# Patient Record
Sex: Female | Born: 1995 | Race: Black or African American | Hispanic: No | Marital: Single | Smoking: Never smoker
Health system: Southern US, Community
[De-identification: ages and names within clinical notes are randomized; demographics above are authoritative.]

## PROBLEM LIST (undated history)

## (undated) DIAGNOSIS — R519 Headache, unspecified: Secondary | ICD-10-CM

## (undated) DIAGNOSIS — F419 Anxiety disorder, unspecified: Secondary | ICD-10-CM

## (undated) DIAGNOSIS — N3010 Interstitial cystitis (chronic) without hematuria: Secondary | ICD-10-CM

## (undated) DIAGNOSIS — R87629 Unspecified abnormal cytological findings in specimens from vagina: Secondary | ICD-10-CM

## (undated) DIAGNOSIS — F32A Depression, unspecified: Secondary | ICD-10-CM

## (undated) HISTORY — DX: Interstitial cystitis (chronic) without hematuria: N30.10

## (undated) HISTORY — DX: Headache, unspecified: R51.9

## (undated) HISTORY — DX: Unspecified abnormal cytological findings in specimens from vagina: R87.629

## (undated) HISTORY — DX: Depression, unspecified: F32.A

## (undated) HISTORY — DX: Anxiety disorder, unspecified: F41.9

## (undated) HISTORY — PX: NO PAST SURGERIES: SHX2092

---

## 2018-04-05 DIAGNOSIS — B009 Herpesviral infection, unspecified: Secondary | ICD-10-CM

## 2018-04-05 HISTORY — DX: Herpesviral infection, unspecified: B00.9

## 2019-10-20 ENCOUNTER — Emergency Department (HOSPITAL_BASED_OUTPATIENT_CLINIC_OR_DEPARTMENT_OTHER)
Admission: EM | Admit: 2019-10-20 | Discharge: 2019-10-21 | Disposition: A | Discharge status: Home / Self Care | Payer: BLUE CROSS/BLUE SHIELD | Attending: Emergency Medicine | Admitting: Emergency Medicine

## 2019-10-20 ENCOUNTER — Other Ambulatory Visit: Payer: Self-pay

## 2019-10-20 ENCOUNTER — Encounter (HOSPITAL_BASED_OUTPATIENT_CLINIC_OR_DEPARTMENT_OTHER): Payer: Self-pay

## 2019-10-20 ENCOUNTER — Emergency Department (HOSPITAL_BASED_OUTPATIENT_CLINIC_OR_DEPARTMENT_OTHER): Payer: BLUE CROSS/BLUE SHIELD

## 2019-10-20 DIAGNOSIS — E860 Dehydration: Secondary | ICD-10-CM | POA: Insufficient documentation

## 2019-10-20 DIAGNOSIS — R079 Chest pain, unspecified: Secondary | ICD-10-CM | POA: Diagnosis present

## 2019-10-20 DIAGNOSIS — Z202 Contact with and (suspected) exposure to infections with a predominantly sexual mode of transmission: Secondary | ICD-10-CM | POA: Insufficient documentation

## 2019-10-20 LAB — TROPONIN I (HIGH SENSITIVITY)
Troponin I (High Sensitivity): <2 ng/L (ref <18)
Troponin I (High Sensitivity): <2 ng/L (ref <18)

## 2019-10-20 LAB — WET PREP, GENITAL
Clue Cells Wet Prep HPF POC: NONE SEEN
Sperm: NONE SEEN
Trich, Wet Prep: NONE SEEN
Yeast Wet Prep HPF POC: NONE SEEN

## 2019-10-20 LAB — CBC
HCT: 42.9 % (ref 36.0–46.0)
Hemoglobin: 14.3 g/dL (ref 12.0–15.0)
MCH: 31.2 pg (ref 26.0–34.0)
MCHC: 33.3 g/dL (ref 30.0–36.0)
MCV: 93.7 fL (ref 80.0–100.0)
Platelets: 314 10*3/uL (ref 150–400)
RBC: 4.58 MIL/uL (ref 3.87–5.11)
RDW: 12.3 % (ref 11.5–15.5)
WBC: 5.9 10*3/uL (ref 4.0–10.5)
nRBC: 0 % (ref 0.0–0.2)

## 2019-10-20 LAB — BASIC METABOLIC PANEL
Anion gap: 11 (ref 5–15)
BUN: 12 mg/dL (ref 6–20)
CO2: 27 mmol/L (ref 22–32)
Calcium: 9.4 mg/dL (ref 8.9–10.3)
Chloride: 101 mmol/L (ref 98–111)
Creatinine, Ser: 0.78 mg/dL (ref 0.44–1.00)
GFR calc Af Amer: >60 mL/min (ref >60)
GFR calc non Af Amer: >60 mL/min (ref >60)
Glucose, Bld: 93 mg/dL (ref 70–99)
Potassium: 4.1 mmol/L (ref 3.5–5.1)
Sodium: 139 mmol/L (ref 135–145)

## 2019-10-20 LAB — PREGNANCY, URINE: Preg Test, Ur: NEGATIVE

## 2019-10-20 MED ORDER — IBUPROFEN 400 MG PO TABS
400.0000 mg | ORAL_TABLET | Freq: Once | ORAL | Status: AC
  Administered 2019-10-20: 400 mg via ORAL
  Filled 2019-10-20: qty 1

## 2019-10-20 MED ORDER — LACTATED RINGERS IV BOLUS
1000.0000 mL | Freq: Once | INTRAVENOUS | Status: AC
  Administered 2019-10-20: 1000 mL via INTRAVENOUS

## 2019-10-20 MED ORDER — ACETAMINOPHEN 325 MG PO TABS
650.0000 mg | ORAL_TABLET | Freq: Once | ORAL | Status: AC
  Administered 2019-10-20: 650 mg via ORAL
  Filled 2019-10-20: qty 2

## 2019-10-20 NOTE — ED Triage Notes (Signed)
1) Pt c/o chest pain while at rest. Pt describes pain as tight and non-radiating. Pt reports that she has associated ShOB.   2) Pt also states she had strep 2 weeks ago and is still having some throat irritation.   3) Pt requesting STD testing. Pt denies any symptoms.
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
## 2019-10-21 LAB — RPR: RPR Ser Ql: NONREACTIVE

## 2019-10-21 LAB — HIV ANTIBODY (ROUTINE TESTING W REFLEX): HIV Screen 4th Generation wRfx: NONREACTIVE

## 2019-10-21 MED ORDER — LIDOCAINE HCL (PF) 1 % IJ SOLN
INTRAMUSCULAR | Status: AC
  Administered 2019-10-21: 2 mL
  Filled 2019-10-21: qty 5

## 2019-10-21 MED ORDER — CEFTRIAXONE SODIUM 500 MG IJ SOLR
500.0000 mg | Freq: Once | INTRAMUSCULAR | Status: AC
  Administered 2019-10-21: 500 mg via INTRAMUSCULAR
  Filled 2019-10-21: qty 500

## 2019-10-21 MED ORDER — DOXYCYCLINE HYCLATE 100 MG PO CAPS
100.0000 mg | ORAL_CAPSULE | Freq: Two times a day (BID) | ORAL | 0 refills | Status: DC
Start: 2019-10-21 — End: 2020-08-11

## 2019-10-21 MED ORDER — LIDOCAINE HCL (PF) 1 % IJ SOLN: 2.0000 mL | Freq: Once | INTRAMUSCULAR | Status: AC

## 2019-10-21 NOTE — Discharge Instructions (Signed)
Take tylenol/ibuprofen as needed for pain.  Drink plenty of fluid and rest.  You can also try tea with honey for sore throat.

## 2019-10-22 LAB — GC/CHLAMYDIA PROBE AMP (~~LOC~~) NOT AT ARMC
Chlamydia: NEGATIVE
Comment: NEGATIVE
Comment: NORMAL
Neisseria Gonorrhea: NEGATIVE

## 2020-02-08 ENCOUNTER — Encounter (HOSPITAL_COMMUNITY): Payer: Self-pay | Admitting: Emergency Medicine

## 2020-02-08 ENCOUNTER — Other Ambulatory Visit: Payer: Self-pay

## 2020-02-08 DIAGNOSIS — R1030 Lower abdominal pain, unspecified: Secondary | ICD-10-CM | POA: Diagnosis not present

## 2020-02-08 DIAGNOSIS — Z3A08 8 weeks gestation of pregnancy: Secondary | ICD-10-CM | POA: Insufficient documentation

## 2020-02-08 DIAGNOSIS — O26891 Other specified pregnancy related conditions, first trimester: Secondary | ICD-10-CM | POA: Insufficient documentation
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
## 2020-02-09 ENCOUNTER — Emergency Department (HOSPITAL_COMMUNITY)
Admission: EM | Admit: 2020-02-09 | Discharge: 2020-02-09 | Disposition: A | Discharge status: Home / Self Care | Payer: BLUE CROSS/BLUE SHIELD | Attending: Emergency Medicine | Admitting: Emergency Medicine

## 2020-02-09 DIAGNOSIS — O26891 Other specified pregnancy related conditions, first trimester: Secondary | ICD-10-CM

## 2020-02-09 DIAGNOSIS — R109 Unspecified abdominal pain: Secondary | ICD-10-CM

## 2020-02-09 MED ORDER — ACETAMINOPHEN 325 MG PO TABS
650.0000 mg | ORAL_TABLET | Freq: Once | ORAL | Status: AC
  Administered 2020-02-09: 650 mg via ORAL
  Filled 2020-02-09: qty 2
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
## 2020-02-09 NOTE — Discharge Instructions (Addendum)
Make sure to get plenty of rest.  Take Tylenol as needed.  If you develop vaginal bleeding or worsening cramping, you should call your OB/GYN for further instruction.  At this time your ultrasound is reassuring.

## 2020-02-15 LAB — OB RESULTS CONSOLE ANTIBODY SCREEN: Antibody Screen: NEGATIVE

## 2020-02-15 LAB — OB RESULTS CONSOLE HEPATITIS B SURFACE ANTIGEN: Hepatitis B Surface Ag: NEGATIVE

## 2020-02-15 LAB — OB RESULTS CONSOLE ABO/RH: RH Type: POSITIVE

## 2020-02-15 LAB — OB RESULTS CONSOLE HIV ANTIBODY (ROUTINE TESTING): HIV: NONREACTIVE

## 2020-02-15 LAB — OB RESULTS CONSOLE RUBELLA ANTIBODY, IGM: Rubella: IMMUNE

## 2020-02-15 LAB — OB RESULTS CONSOLE RPR: RPR: NONREACTIVE

## 2020-03-13 ENCOUNTER — Other Ambulatory Visit: Payer: Self-pay | Admitting: Obstetrics and Gynecology

## 2020-03-14 ENCOUNTER — Other Ambulatory Visit: Payer: Self-pay | Admitting: Obstetrics and Gynecology

## 2020-03-14 DIAGNOSIS — O0991 Supervision of high risk pregnancy, unspecified, first trimester: Secondary | ICD-10-CM

## 2020-03-18 ENCOUNTER — Other Ambulatory Visit: Payer: Self-pay

## 2020-03-18 ENCOUNTER — Ambulatory Visit (HOSPITAL_BASED_OUTPATIENT_CLINIC_OR_DEPARTMENT_OTHER): Payer: BLUE CROSS/BLUE SHIELD | Admitting: Genetic Counselor

## 2020-03-18 ENCOUNTER — Ambulatory Visit: Payer: BLUE CROSS/BLUE SHIELD | Admitting: *Deleted

## 2020-03-18 ENCOUNTER — Ambulatory Visit: Payer: Self-pay | Admitting: Genetic Counselor

## 2020-03-18 ENCOUNTER — Ambulatory Visit: Payer: Self-pay

## 2020-03-18 ENCOUNTER — Ambulatory Visit: Payer: BLUE CROSS/BLUE SHIELD | Attending: Obstetrics and Gynecology

## 2020-03-18 VITALS — BP 132/69 | HR 85

## 2020-03-18 DIAGNOSIS — O281 Abnormal biochemical finding on antenatal screening of mother: Secondary | ICD-10-CM | POA: Insufficient documentation

## 2020-03-18 DIAGNOSIS — Z315 Encounter for genetic counseling: Secondary | ICD-10-CM | POA: Insufficient documentation

## 2020-03-18 DIAGNOSIS — O285 Abnormal chromosomal and genetic finding on antenatal screening of mother: Secondary | ICD-10-CM

## 2020-03-18 DIAGNOSIS — O280 Abnormal hematological finding on antenatal screening of mother: Secondary | ICD-10-CM

## 2020-03-18 DIAGNOSIS — O0991 Supervision of high risk pregnancy, unspecified, first trimester: Secondary | ICD-10-CM

## 2020-03-18 NOTE — Progress Notes (Signed)
ADDENDUM 4:45 PM: I called Ms. Ertle to inform her about her results from her benefits investigation. Per a billing representative from [facility], the expected out of pocket cost for NIPS if pursued through insurance is $450. Ms. Dolby elected to pursue NIPS through [facility]'s patient pay price of $99. She will return to our office during her lunch break tomorrow (03/19/20) to have a sample drawn for NIPS. Results will take approximately one week to be returned. I will call Ms. Pagliuca once results become available.  ------------------------------------------------------------------------------------------------------------------------  03/18/2020  Clovis Cao May 29, 1995 MRN: [id number] DOV: 03/18/2020  Ms. 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 Ms. Jawad was accompanied to her appointment by her partner, Irine Seal.   Indication for genetic counseling - First trimester screening high-risk for trisomy 15  Prenatal history  Ms. Billey is a G35P0, 24 y.o. female. Her current pregnancy has completed [redacted]w[redacted]d (Estimated Date of Delivery: 09/16/20).  Ms. Seivert denied exposure to environmental toxins or chemical agents. She denied the use of alcohol, tobacco or street drugs. She reported taking prenatal vitamins and Sertraline. She denied significant viral illnesses, fevers, and bleeding during the course of her pregnancy. She reported a history of interstitial cystitis which has improved during pregnancy. Her medical and surgical histories were noncontributory.  Family History  A three generation pedigree was drafted and reviewed. The family history is remarkable for the following:  - Ms. Woolbright's mother was reportedly born with a congenital heart defect for which she has had open heart surgery twice. Congenital heart defects (CHDs) are most often multifactorial in etiology, but can  also result from chromosome aberrations, single gene conditions, or teratogenic exposures. We discussed that isolated, nonsyndromic CHDs occur in ~0.5-1% of the general population. If Ms. Stoudt's mother had an isolated CHD, the risk of recurrence for Ms. Stelly's children may be ~1%. If however, her mother had an underlying genetic condition that caused the CHD, the risk of recurrence could be increased. In this case, the specific chance of recurrence would depend on the inheritance of the condition. We discussed that a detailed anatomy ultrasound will be performed around 18-20 weeks' gestation to assess the development of the fetus's heart.   - Mr. Yewitt had growth delays requiring the use of growth hormones. He also had ADHD and learning difficulties that he was pulled out of class for, specifically for help in [place]. Diabetes, problems with blood pressure, and vision problems including cataracts are common on his paternal side of the family. We discussed that oftentimes, these kinds of health problems are multifactorial in nature, occurring due to a combination of genetic and environmental factors that can be difficult to identify. I encouraged the couple to inform their chosen pediatrician about their family history information should any concerns arise in their children.  The remaining family histories were reviewed and found to be noncontributory for birth defects, intellectual disability, recurrent pregnancy loss, and known genetic conditions. Ms. Benbrook had limited information about her paternal family history; thus, risk assessment was limited.   The patient's country of origin is the [place]. The father of the pregnancy's countries of origin are [place] and the [place]. Consanguinity was denied. Ms. Gama's partner was uncertain whether or not he has Ashkenazi Jewish ancestry. Pedigree will be scanned under Media.  Discussion  First trimester screening results:  Ms. Primeau  was referred for genetic counseling as  results from first trimester screening came back positive for an increased risk of trisomy 43, AKA Down syndrome, in the current pregnancy. Based on results from first trimester screening, the risk for Down syndrome in the current pregnancy increased from Ms. Curenton's 1 in 1020 age-related risk to 1 in 131 (0.8%). Additionally, the risk for trisomy 13/18 in the current pregnancy was decreased to less than 1 in 10,000 based on the results from this screen.   Down syndrome is one of the most common extra chromosome conditions, as approximately 1 in 800 babies are born with this condition. There are different types of Down syndrome, with each type determined by the arrangement of the chromosome 21 pair. Most cases are caused by an entire extra copy of chromosome 21 (trisomy 21). We reviewed that Down syndrome most commonly occurs by chance due to an error in chromosomal division during the formation of egg and sperm cells in a process called nondisjunction.    We reviewed that Down syndrome is characterized by a distinctive facial appearance, mild to moderate intellectual disability, and an increased chance for a heart defect. Approximately half of babies with Down syndrome are born with a heart defect that may require surgery after birth. While many children with Down syndrome look similar to each other, each child with Down syndrome is unique and will have many more features in common with his or her own family members. Children with Down syndrome also have an increased chance for thyroid problems, which can range from an underactive to an overactive thyroid. Additionally, low muscle tone, gastrointestinal abnormalities, vision problems, and respiratory and ear infections are more common among babies with Down syndrome. We discussed that there are many more features that can be associated with Down syndrome; however, it is not possible to accurately predict all features that  would be present in an individual with Down syndrome prenatally. Additionally, there is a high degree of variability seen among children who have this condition, meaning that every child with Down syndrome will not be affected in exactly the same way, and some children will have more or less features than others. It is not possible to predict what strengths and weaknesses a child with Down syndrome will have, just like it is not possible to predict this for any child.  We discussed that first trimester screening measures levels of hormones that are made by the body during pregnancy. One of these hormones is called human chorionic gonadotropin (hCG). Another of these hormones is called pregnancy-associated plasma protein A (PAPP-A). Ms. Dreier's hCG level was increased (2.46 MoM) on first trimester screening and her PAPP-A level was decreased (0.35 MoM). We reviewed that increased levels of hCG and decreased levels of PAPP-A can be seen in pregnancies affected by Down syndrome, but can also be associated with preeclampsia, fetal growth restriction, preterm birth, and fetal loss. Thus, even if the fetus does not have Down syndrome, the pregnancy may be at risk for other adverse obstetrical outcomes. We discussed that it is recommended that Ms. Caillouet have a third trimester ultrasound to assess fetal growth.  Other testing options:  We reviewed additional available screening and testing options for chromosomal aneuploidies such as trisomy 44. Firstly, Ms. Trang will have an anatomy ultrasound performed around 18-20 weeks' gestation to assess for fetal anomalies or markers suggestive of aneuploidy. Ms. Gane was counseled that ~50% of fetuses with Down syndrome demonstrate a sign of the respective conditions on anatomy ultrasound. A complete 12-14 week ultrasound was  performed today prior to our visit. The ultrasound report will be sent under separate cover. There were no visualized fetal anomalies at this early  gestational age or markers suggestive of aneuploidy.  Secondly, we reviewed noninvasive prenatal screening (NIPS) as another available aneuploidy screening option to further refine the chance for Down syndrome in the pregnancy. Specifically, we discussed that NIPS analyzes cell free DNA originating from the placenta that is found in the maternal blood circulation during pregnancy instead of hormone levels. NIPS is not diagnostic for chromosome conditions, but can provide information regarding the presence or absence of extra fetal DNA for chromosomes 13, 18, 21, and the sex chromosomes. Thus, it would not identify or rule out all fetal aneuploidy. The reported detection rate is 91-99% for trisomies 21, 18, 13, and sex chromosome aneuploidies, which is higher than the detection rate associated with first trimester screening. The false positive rate is reported to be less than 0.1% for any of the conditions on NIPS, which is lower than the false positive rate associated with first trimester screening. Ms. Salaam indicated that she is interested in pursuing NIPS.   Finally, Ms. Carbonneau was counseled regarding the option of diagnostic testing via chorionic villus sampling (CVS) or amniocentesis. We discussed the technical aspects of each procedure and quoted up to a 1 in 500 (0.2%) risk for spontaneous pregnancy loss or other adverse pregnancy outcomes as a result of either procedure. Cultured cells from either a placental or amniotic fluid sample allow for the visualization of a fetal karyotype, which can detect >99% of large chromosomal aberrations, including trisomy 21. Chromosomal microarray can also be performed to identify smaller deletions or duplications of fetal chromosomal material. After careful consideration, Ms. Sobotta declined diagnostic testing at this time. She understands that diagnostic testing is available at any point through the end of pregnancy and that she may opt to undergo the procedure at a later  date should she change her mind. She also understands that diagnostic testing is the only way to definitively determine if the fetus has trisomy 21 prenatally. She prefers to pursue NIPS before considering diagnostic testing.  Plan:  Ms. Dethlefs is interested in undergoing NIPS. However, she requested that I perform a benefits investigation to determine the expected out of pocket cost before ordering testing. If testing is expected to cost >$99 when pursued through insurance, Ms. Karwowski will elect to proceed with NIPS through the laboratory [facility] who offers a patient pay price of $99. I will contact her when the benefits investigation is complete and schedule her for a blood draw.  I counseled Ms. Perret regarding the above risks and available options. The approximate face-to-face time with the genetic counselor was 40 minutes.  In summary:  Discussed first trimester screening results and options for follow-up testing  1 in 131 (0.8%) for current pregnancy to be affected by Down syndrome  Would like to pursue NIPS. I will perform benefits investigation to determine out of pocket cost. Patient will pursue $99 patient pay price if estimated cost through insurance is greater. I will facilitate sample collection & follow results  Reviewed results of ultrasound  No fetal anomalies or markers seen at this early gestational age  Offered additional testing and screening  Declined chorionic villus sampling  Reviewed family history concerns   Gershon Crane, MS, Aeronautical engineer

## 2020-03-19 ENCOUNTER — Ambulatory Visit: Payer: BLUE CROSS/BLUE SHIELD | Attending: Obstetrics and Gynecology

## 2020-04-03 ENCOUNTER — Ambulatory Visit: Payer: BLUE CROSS/BLUE SHIELD | Attending: Obstetrics and Gynecology

## 2020-04-03 ENCOUNTER — Other Ambulatory Visit: Payer: Self-pay

## 2020-04-03 DIAGNOSIS — Z3482 Encounter for supervision of other normal pregnancy, second trimester: Secondary | ICD-10-CM | POA: Insufficient documentation

## 2020-04-03 DIAGNOSIS — Z3A18 18 weeks gestation of pregnancy: Secondary | ICD-10-CM | POA: Insufficient documentation

## 2020-04-03 NOTE — Progress Notes (Signed)
Blood drawn in MFM today. 

## 2020-04-09 ENCOUNTER — Other Ambulatory Visit: Payer: Self-pay

## 2020-04-09 ENCOUNTER — Telehealth: Payer: Self-pay | Admitting: Genetic Counselor

## 2020-04-09 NOTE — Telephone Encounter (Signed)
I called Ms. Mchugh to discuss her negative noninvasive prenatal screening (NIPS) result. Specifically, Ms. Pennypacker had NIPS through the laboratory [facility]. Testing was offered because of first trimester screening results that were high risk for trisomy 21 AKA Down syndrome. These negative results demonstrated an expected representation of chromosome 21, 18, 13, and sex chromosome material, greatly reducing the likelihood of trisomies 72, 29, or 45 and sex chromosome aneuploidies for the pregnancy. Ms. Nygard requested to know about the expected fetal sex, which is female.  NIPS analyzes placental DNA in maternal circulation. I reminded Ms. Bassford that NIPS is considered to be highly specific and sensitive, but is not considered to be a diagnostic test. We reviewed that this testing identifies 99% of pregnancies with trisomies 21, which is a higher sensitivity than first trimester screening. We also reviewed that NIPS can detect 91-99% of cases of trisomy 63, trisomy 5, and sex chromosome abnormalities. However, cases of these conditions may rarely be missed and NIPS does not test for all genetic conditions. Ms. Jurczyk was reminded that diagnostic testing via amniocentesis is available through the end of her pregnancy should she be interested in confirming this result. Ms. Mcqueary was relieve to hear about these results and confirmed that she had no questions further questions at this time.  Gershon Crane, MS, [facility] Genetic Counselor

## 2020-08-09 ENCOUNTER — Encounter (HOSPITAL_COMMUNITY): Payer: Self-pay | Admitting: Obstetrics and Gynecology

## 2020-08-09 ENCOUNTER — Observation Stay (HOSPITAL_COMMUNITY)
Admission: AD | Admit: 2020-08-09 | Discharge: 2020-08-11 | Disposition: A | Discharge status: Home / Self Care | Payer: 59 | Attending: Obstetrics and Gynecology | Admitting: Obstetrics and Gynecology

## 2020-08-09 ENCOUNTER — Other Ambulatory Visit: Payer: Self-pay

## 2020-08-09 DIAGNOSIS — Z3A34 34 weeks gestation of pregnancy: Secondary | ICD-10-CM | POA: Insufficient documentation

## 2020-08-09 DIAGNOSIS — O113 Pre-existing hypertension with pre-eclampsia, third trimester: Secondary | ICD-10-CM | POA: Diagnosis not present

## 2020-08-09 DIAGNOSIS — Z20822 Contact with and (suspected) exposure to covid-19: Secondary | ICD-10-CM | POA: Insufficient documentation

## 2020-08-09 DIAGNOSIS — O1490 Unspecified pre-eclampsia, unspecified trimester: Secondary | ICD-10-CM

## 2020-08-09 DIAGNOSIS — O10013 Pre-existing essential hypertension complicating pregnancy, third trimester: Secondary | ICD-10-CM | POA: Diagnosis not present

## 2020-08-09 DIAGNOSIS — O288 Other abnormal findings on antenatal screening of mother: Secondary | ICD-10-CM

## 2020-08-09 LAB — WET PREP, GENITAL
Clue Cells Wet Prep HPF POC: NONE SEEN
Sperm: NONE SEEN
Trich, Wet Prep: NONE SEEN
Yeast Wet Prep HPF POC: NONE SEEN

## 2020-08-09 LAB — COMPREHENSIVE METABOLIC PANEL
ALT: 12 U/L (ref 0–44)
AST: 17 U/L (ref 15–41)
Albumin: 2.6 g/dL — ABNORMAL LOW (ref 3.5–5.0)
Alkaline Phosphatase: 135 U/L — ABNORMAL HIGH (ref 38–126)
Anion gap: 9 (ref 5–15)
BUN: 6 mg/dL (ref 6–20)
CO2: 23 mmol/L (ref 22–32)
Calcium: 9.1 mg/dL (ref 8.9–10.3)
Chloride: 103 mmol/L (ref 98–111)
Creatinine, Ser: 0.57 mg/dL (ref 0.44–1.00)
GFR, Estimated: >60 mL/min (ref >60)
Glucose, Bld: 96 mg/dL (ref 70–99)
Potassium: 3.8 mmol/L (ref 3.5–5.1)
Sodium: 135 mmol/L (ref 135–145)
Total Bilirubin: 0.4 mg/dL (ref 0.3–1.2)
Total Protein: 6.2 g/dL — ABNORMAL LOW (ref 6.5–8.1)

## 2020-08-09 LAB — URINALYSIS, ROUTINE W REFLEX MICROSCOPIC
Bilirubin Urine: NEGATIVE
Glucose, UA: >=500 mg/dL — AB
Ketones, ur: NEGATIVE mg/dL
Nitrite: NEGATIVE
Protein, ur: >=300 mg/dL — AB
Specific Gravity, Urine: 1.011 (ref 1.005–1.030)
pH: 7 (ref 5.0–8.0)

## 2020-08-09 LAB — PROTEIN / CREATININE RATIO, URINE
Creatinine, Urine: 55.57 mg/dL
Protein Creatinine Ratio: 6.32 mg/mg{Cre} — ABNORMAL HIGH (ref 0.00–0.15)
Total Protein, Urine: 351 mg/dL

## 2020-08-09 LAB — CBC
HCT: 43 % (ref 36.0–46.0)
Hemoglobin: 13.9 g/dL (ref 12.0–15.0)
MCH: 29.3 pg (ref 26.0–34.0)
MCHC: 32.3 g/dL (ref 30.0–36.0)
MCV: 90.7 fL (ref 80.0–100.0)
Platelets: 254 10*3/uL (ref 150–400)
RBC: 4.74 MIL/uL (ref 3.87–5.11)
RDW: 13.7 % (ref 11.5–15.5)
WBC: 8.6 10*3/uL (ref 4.0–10.5)
nRBC: 0.5 % — ABNORMAL HIGH (ref 0.0–0.2)

## 2020-08-09 LAB — RESP PANEL BY RT-PCR (FLU A&B, COVID) ARPGX2
Influenza A by PCR: NEGATIVE
Influenza B by PCR: NEGATIVE
SARS Coronavirus 2 by RT PCR: NEGATIVE

## 2020-08-09 LAB — TYPE AND SCREEN
ABO/RH(D): A POS
Antibody Screen: NEGATIVE

## 2020-08-09 LAB — GROUP B STREP BY PCR: Group B strep by PCR: POSITIVE — AB

## 2020-08-09 MED ORDER — DOCUSATE SODIUM 100 MG PO CAPS
100.0000 mg | ORAL_CAPSULE | Freq: Every day | ORAL | Status: DC
  Administered 2020-08-09 – 2020-08-11 (×2): 100 mg via ORAL
  Filled 2020-08-09 (×3): qty 1

## 2020-08-09 MED ORDER — LABETALOL HCL 5 MG/ML IV SOLN: 20.0000 mg | INTRAVENOUS | Status: DC | PRN

## 2020-08-09 MED ORDER — LABETALOL HCL 200 MG PO TABS
200.0000 mg | ORAL_TABLET | Freq: Two times a day (BID) | ORAL | Status: DC
  Administered 2020-08-09 – 2020-08-11 (×4): 200 mg via ORAL
  Filled 2020-08-09 (×4): qty 1

## 2020-08-09 MED ORDER — PRENATAL MULTIVITAMIN CH
1.0000 | ORAL_TABLET | Freq: Every day | ORAL | Status: DC
  Administered 2020-08-09 – 2020-08-10 (×2): 1 via ORAL
  Filled 2020-08-09 (×2): qty 1

## 2020-08-09 MED ORDER — SERTRALINE HCL 50 MG PO TABS
25.0000 mg | ORAL_TABLET | Freq: Every day | ORAL | Status: DC
  Administered 2020-08-09 – 2020-08-11 (×2): 25 mg via ORAL
  Filled 2020-08-09 (×3): qty 1

## 2020-08-09 MED ORDER — LABETALOL HCL 5 MG/ML IV SOLN: 40.0000 mg | INTRAVENOUS | Status: DC | PRN

## 2020-08-09 MED ORDER — ACETAMINOPHEN 325 MG PO TABS: 650.0000 mg | ORAL_TABLET | ORAL | Status: DC | PRN

## 2020-08-09 MED ORDER — HYDRALAZINE HCL 20 MG/ML IJ SOLN: 10.0000 mg | INTRAMUSCULAR | Status: DC | PRN

## 2020-08-09 MED ORDER — LABETALOL HCL 5 MG/ML IV SOLN: 80.0000 mg | INTRAVENOUS | Status: DC | PRN

## 2020-08-09 MED ORDER — CALCIUM CARBONATE ANTACID 500 MG PO CHEW: 2.0000 | CHEWABLE_TABLET | ORAL | Status: DC | PRN

## 2020-08-09 MED ORDER — VALACYCLOVIR HCL 500 MG PO TABS
500.0000 mg | ORAL_TABLET | Freq: Two times a day (BID) | ORAL | Status: DC
  Administered 2020-08-09 – 2020-08-11 (×4): 500 mg via ORAL
  Filled 2020-08-09 (×4): qty 1

## 2020-08-09 NOTE — MAU Note (Signed)
BP 156/105 not validated-was taken during covid collection. Pt to bathroom. Will repeat 5 min after return

## 2020-08-09 NOTE — MAU Provider Note (Signed)
Chief Complaint:  Abdominal Pain and Vaginal Bleeding   Event Date/Time   First Provider Initiated Contact with Patient 08/09/20 1123     HPI: Tiffany Kirby is a 25 y.o. G1P0 at [redacted]w[redacted]d who presents to maternity admissions reporting post-coital bleeding that began as bright red bleeding then subsided to pink, then brown and is now gone. Also does not have any leaking of fluid or decreased fetal movement. While in MAU for bleeding workup, began having severe range pressures. Denies headache, visual disturbances or epigastric pain.   Pregnancy Course: Receives care at [facility] - denies any pregnancy complications  Past Medical History:  Diagnosis Date  . Anxiety   . Depression   . Headache   . Herpes 2020  . Interstitial cystitis   . Vaginal Pap smear, abnormal    OB History  Gravida Para Term Preterm AB Living  1            SAB IAB Ectopic Multiple Live Births               # Outcome Date GA Lbr Len/2nd Weight Sex Delivery Anes PTL Lv  1 Current            Past Surgical History:  Procedure Laterality Date  . NO PAST SURGERIES     Family History  Problem Relation Age of Onset  . Depression Mother   . Drug abuse Mother   . Heart disease Mother   . Anxiety disorder Maternal Grandmother   . Arthritis Maternal Grandmother   . Hypertension Maternal Grandmother    Social History   Tobacco Use  . Smoking status: Never Smoker  . Smokeless tobacco: Never Used  Vaping Use  . Vaping Use: Former  . Substances: Nicotine, Flavoring  Substance Use Topics  . Alcohol use: Not Currently  . Drug use: Not Currently    Types: Marijuana   No Known Allergies Medications Prior to Admission  Medication Sig Dispense Refill Last Dose  . pantoprazole (PROTONIX) 40 MG tablet Take 40 mg by mouth daily as needed.   Past Week at Unknown time  . amitriptyline (ELAVIL) 10 MG tablet amitriptyline 10 mg tablet  TAKE 1 TABLET BY MOUTH ONCE DAILY (Patient not taking: Reported on 02/09/2020)      . doxycycline (VIBRAMYCIN) 100 MG capsule Take 1 capsule (100 mg total) by mouth 2 (two) times daily. (Patient not taking: Reported on 02/09/2020) 14 capsule 0   . Prenatal MV & Min w/FA-DHA (PRENATAL ADULT GUMMY/DHA/FA PO) Take 2 tablets by mouth daily.   08/07/2020  . sertraline (ZOLOFT) 25 MG tablet Take 25 mg by mouth daily.   08/07/2020  . valACYclovir (VALTREX) 500 MG tablet valacyclovir 500 mg tablet  Take 1 tablet by mouth once daily (Patient not taking: No sig reported)   More than a month at Unknown time   I have reviewed patient's Past Medical Hx, Surgical Hx, Family Hx, Social Hx, medications and allergies.   ROS:  Review of Systems  Constitutional: Negative for fatigue and fever.  Eyes: Negative for photophobia and visual disturbance.  Respiratory: Negative for chest tightness and shortness of breath.   Gastrointestinal: Negative for nausea and vomiting.  Genitourinary: Negative for vaginal discharge.  Neurological: Negative for dizziness, syncope, light-headedness and headaches.  Psychiatric/Behavioral: The patient is nervous/anxious (concerned about bleeding and hypertension).    Physical Exam   Patient Vitals for the past 24 hrs:  BP Temp Temp src Pulse Resp SpO2  08/09/20 1533 (!)  156/106 97.9 F (36.6 C) Oral 81 18 98 %  08/09/20 1501 (!) 156/105 -- -- 85 -- --  08/09/20 1500 -- -- -- -- -- 100 %  08/09/20 1455 -- -- -- -- -- 100 %  08/09/20 1450 -- -- -- -- -- 100 %  08/09/20 1446 (!) 151/95 -- -- 77 -- --  08/09/20 1445 -- -- -- -- -- 100 %  08/09/20 1440 -- -- -- -- -- 100 %  08/09/20 1435 -- -- -- -- -- 100 %  08/09/20 1431 138/85 -- -- 81 -- --  08/09/20 1430 -- -- -- -- -- 100 %  08/09/20 1425 -- -- -- -- -- 100 %  08/09/20 1420 -- -- -- -- -- 99 %  08/09/20 1416 113/73 -- -- 74 -- --  08/09/20 1415 -- -- -- -- -- 99 %  08/09/20 1410 -- -- -- -- -- 99 %  08/09/20 1405 -- -- -- -- -- 99 %  08/09/20 1401 115/66 -- -- 78 -- --  08/09/20 1400 -- -- -- -- --  99 %  08/09/20 1355 -- -- -- -- -- 99 %  08/09/20 1350 -- -- -- -- -- 99 %  08/09/20 1346 130/89 -- -- 80 -- --  08/09/20 1345 -- -- -- -- -- 99 %  08/09/20 1340 -- -- -- -- -- 99 %  08/09/20 1335 -- -- -- -- -- 99 %  08/09/20 1331 115/71 -- -- 73 -- --  08/09/20 1330 -- -- -- -- -- 99 %  08/09/20 1325 -- -- -- -- -- 99 %  08/09/20 1320 -- -- -- -- -- 99 %  08/09/20 1315 117/71 -- -- 79 -- 100 %  08/09/20 1310 -- -- -- -- -- 100 %  08/09/20 1305 -- -- -- -- -- 100 %  08/09/20 1300 115/76 -- -- 78 -- 100 %  08/09/20 1255 -- -- -- -- -- 100 %  08/09/20 1250 -- -- -- -- -- 99 %  08/09/20 1245 (!) 122/109 -- -- 89 -- 100 %  08/09/20 1235 -- -- -- -- -- 98 %  08/09/20 1230 (!) 141/102 -- -- 93 -- 98 %  08/09/20 1225 -- -- -- -- -- 100 %  08/09/20 1220 -- -- -- -- -- 100 %  08/09/20 1216 (!) 145/109 -- -- 89 -- --  08/09/20 1215 -- -- -- -- -- 100 %  08/09/20 1210 -- -- -- -- -- 99 %  08/09/20 1205 -- -- -- -- -- 99 %  08/09/20 1201 (!) 136/105 -- -- 100 -- --  08/09/20 1200 -- -- -- -- -- 99 %  08/09/20 1155 -- -- -- -- -- 98 %  08/09/20 1150 -- -- -- -- -- 98 %  08/09/20 1146 (!) 140/101 -- -- 93 -- --  08/09/20 1145 -- -- -- -- -- 97 %  08/09/20 1140 -- -- -- -- -- 98 %  08/09/20 1135 -- -- -- -- -- 99 %  08/09/20 1131 (!) 154/114 -- -- 94 -- --  08/09/20 1130 -- -- -- -- -- 98 %  08/09/20 1125 -- -- -- -- -- 98 %  08/09/20 1120 -- -- -- -- -- 98 %  08/09/20 1116 (!) 142/107 -- -- 97 -- --  08/09/20 1115 -- -- -- -- -- 97 %  08/09/20 1110 -- -- -- -- -- 98 %  08/09/20 1105 (!) 147/105 -- -- 95 -- 97 %  08/09/20 1057 (!) 152/109 98.9 F (37.2 C) Oral (!) 102 18 95 %  08/09/20 1054 (!) 152/109 -- -- 98 -- --   Constitutional: Well-developed, well-nourished female in no acute distress.  Cardiovascular: normal rate & rhythm, no murmur Respiratory: normal effort, lung sounds clear throughout GI: Abd soft, non-tender, gravid appropriate for gestational age. Pos BS x 4 MS:  Extremities nontender, no edema, normal ROM Neurologic: Alert and oriented x 4.  GU: no CVA tenderness Pelvic: NEFG, physiologic discharge, no blood, cervix clean and hypervascular  Fetal Tracing: reactive Baseline: 145 Variability: moderate Accelerations: 15x15 Decelerations: none Toco: relaxed   Labs: Results for orders placed or performed during the hospital encounter of 08/09/20 (from the past 24 hour(s))  Protein / creatinine ratio, urine     Status: Abnormal   Collection Time: 08/09/20 10:45 AM  Result Value Ref Range   Creatinine, Urine 55.57 mg/dL   Total Protein, Urine 351 mg/dL   Protein Creatinine Ratio 6.32 (H) 0.00 - 0.15 mg/mg[Cre]  Urinalysis, Routine w reflex microscopic Urine, Clean Catch     Status: Abnormal   Collection Time: 08/09/20 11:07 AM  Result Value Ref Range   Color, Urine YELLOW YELLOW   APPearance HAZY (A) CLEAR   Specific Gravity, Urine 1.011 1.005 - 1.030   pH 7.0 5.0 - 8.0   Glucose, UA >=500 (A) NEGATIVE mg/dL   Hgb urine dipstick SMALL (A) NEGATIVE   Bilirubin Urine NEGATIVE NEGATIVE   Ketones, ur NEGATIVE NEGATIVE mg/dL   Protein, ur >=161 (A) NEGATIVE mg/dL   Nitrite NEGATIVE NEGATIVE   Leukocytes,Ua SMALL (A) NEGATIVE   RBC / HPF 0-5 0 - 5 RBC/hpf   WBC, UA 6-10 0 - 5 WBC/hpf   Bacteria, UA RARE (A) NONE SEEN   Squamous Epithelial / LPF 0-5 0 - 5   Mucus PRESENT   Wet prep, genital     Status: Abnormal   Collection Time: 08/09/20 11:35 AM   Specimen: PATH Cytology Cervicovaginal Ancillary Only  Result Value Ref Range   Yeast Wet Prep HPF POC NONE SEEN NONE SEEN   Trich, Wet Prep NONE SEEN NONE SEEN   Clue Cells Wet Prep HPF POC NONE SEEN NONE SEEN   WBC, Wet Prep HPF POC MANY (A) NONE SEEN   Sperm NONE SEEN   CBC     Status: Abnormal   Collection Time: 08/09/20 11:45 AM  Result Value Ref Range   WBC 8.6 4.0 - 10.5 K/uL   RBC 4.74 3.87 - 5.11 MIL/uL   Hemoglobin 13.9 12.0 - 15.0 g/dL   HCT 09.6 04.5 - 40.9 %   MCV 90.7 80.0 -  100.0 fL   MCH 29.3 26.0 - 34.0 pg   MCHC 32.3 30.0 - 36.0 g/dL   RDW 81.1 91.4 - 78.2 %   Platelets 254 150 - 400 K/uL   nRBC 0.5 (H) 0.0 - 0.2 %  Comprehensive metabolic panel     Status: Abnormal   Collection Time: 08/09/20 11:45 AM  Result Value Ref Range   Sodium 135 135 - 145 mmol/L   Potassium 3.8 3.5 - 5.1 mmol/L   Chloride 103 98 - 111 mmol/L   CO2 23 22 - 32 mmol/L   Glucose, Bld 96 70 - 99 mg/dL   BUN 6 6 - 20 mg/dL   Creatinine, Ser 9.56 0.44 - 1.00 mg/dL   Calcium 9.1 8.9 - 21.3 mg/dL   Total Protein 6.2 (L) 6.5 - 8.1 g/dL   Albumin  2.6 (L) 3.5 - 5.0 g/dL   AST 17 15 - 41 U/L   ALT 12 0 - 44 U/L   Alkaline Phosphatase 135 (H) 38 - 126 U/L   Total Bilirubin 0.4 0.3 - 1.2 mg/dL   GFR, Estimated >16>60 >10>60 mL/min   Anion gap 9 5 - 15  Type and screen [facility]     Status: None   Collection Time: 08/09/20 11:45 AM  Result Value Ref Range   ABO/RH(D) A POS    Antibody Screen NEG    Sample Expiration      08/12/2020,2359 Performed at [facility], [address] [place], [place] [id number]    Imaging:  No results found.  MAU Course: Orders Placed This Encounter  Procedures  . Wet prep, genital  . Group B strep by PCR  . Resp Panel by RT-PCR (Flu A&B, Covid) Nasopharyngeal Swab  . Urinalysis, Routine w reflex microscopic  . CBC  . Comprehensive metabolic panel  . Protein / creatinine ratio, urine  . Protein, urine, 24 hour  . Diet regular Room service appropriate? Yes; Fluid consistency: Thin  . Notify Physician  . Measure blood pressure  . Notify physician (specify)  . Vital signs  . Defer vaginal exam for vaginal bleeding or PROM <37 weeks  . Initiate Oral Care Protocol  . Initiate Carrier Fluid Protocol  . SCDs  . Full code  . Airborne and Contact precautions  . Type and screen [facility]  . Fetal nonstress test  . Place in observation (patient's expected length of stay will be less than 2 midnights)    Meds ordered this encounter  Medications  . AND Linked Order Group   . labetalol (NORMODYNE) injection 20 mg   . labetalol (NORMODYNE) injection 40 mg   . labetalol (NORMODYNE) injection 80 mg   . hydrALAZINE (APRESOLINE) injection 10 mg  . acetaminophen (TYLENOL) tablet 650 mg  . docusate sodium (COLACE) capsule 100 mg  . calcium carbonate (TUMS - dosed in mg elemental calcium) chewable tablet 400 mg of elemental calcium  . prenatal multivitamin tablet 1 tablet  . valACYclovir (VALTREX) tablet 500 mg   MDM: Patient had severe range pressure while being checked in so labetalol protocol ordered. Pelvic exam did not note blood, no contractions and cramping from earlier has eased quite a bit.  Preeclampsia labs ordered d/t elevated BP and UA results. P:Cr result called to Dr. Lorane GellGlasser who assumed care and [facility], repeat bloodwork and 24hr urine.  Assessment: Preeclampsia without severe features Post-coital bleeding NST reactive  Plan: Care turned over to Dr. Lorane GellGlasser with [facility]  [facility]  Edd ArbourJamilla Nitisha Civello, CNM, MSN, [facility] Certified Nurse Midwife, [facility]

## 2020-08-09 NOTE — MAU Note (Signed)
Pt reports to mau with c/o vag bleeding last night that was bright red.  Pt reports bleeding has since lightened to from pink, to brown, to now nothing.  Endorses intercourse yesterday.  Pt also reports abd cramping that started last night as well.  BP 152/109 during triage. +FM

## 2020-08-09 NOTE — Progress Notes (Signed)
Patient voided at 1505 in MAU prior to arrival on [facility]. 24 hour urine collection will start with next void. Patient instructed in specimen collection and purpose.

## 2020-08-09 NOTE — H&P (Signed)
Antepartum History and Physical   Tiffany Kirby is a 25 y.o. female G1 that presented for post-coital vaginal bleeding (now resolved). She was noted to have elevated BP and proteinuria in MAU.  She is admitted for observation for presumed preeclampsia without severe features. Patient reports resolved abdominal cramping, no bleeding since small amount last night. She denies headache, vision changes, RUQ or epigastric pain. She feels well overall. She reports stable bilateral edema.   She denies contractions, cramping, leakage of fluid. She reports good fetal movement.   Pregnancy notable for low pappA. Ultrasound evaluations have been WNL and most recent was on 4/21 at 32 weeks, revealed  EFW = 1955g(4'5oz) = 71%, AFI = 14.3cm = 49%  She also has a history of HSV.  OB History    Gravida  1   Para      Term      Preterm      AB      Living        SAB      IAB      Ectopic      Multiple      Live Births             Past Medical History:  Diagnosis Date  . Anxiety   . Depression   . Headache   . Herpes 2020  . Interstitial cystitis   . Vaginal Pap smear, abnormal    Past Surgical History:  Procedure Laterality Date  . NO PAST SURGERIES     Family History: family history includes Anxiety disorder in her maternal grandmother; Arthritis in her maternal grandmother; Depression in her mother; Drug abuse in her mother; Heart disease in her mother; Hypertension in her maternal grandmother. Social History:  reports that she has never smoked. She has never used smokeless tobacco. She reports previous alcohol use. She reports previous drug use. Drug: Marijuana.     Maternal Diabetes: No Genetic Screening: low pappA Maternal Ultrasounds/Referrals: Normal Fetal Ultrasounds or other Referrals:  None Maternal Substance Abuse:  No Significant Maternal Medications:  None Significant Maternal Lab Results:  None Other Comments:  None  Review of Systems - see HPI History    Blood pressure 130/89, pulse 80, temperature 98.9 F (37.2 C), temperature source Oral, resp. rate 18, last menstrual period 12/11/2019, SpO2 99 %. Exam Physical Exam  Gen: alert, resting comfortably, well appearing Chest: nonlabored breathing CV: bilateral 1+ LE edema Abdomen: soft, nontender SSE: per MAU provider, no blood, no lesions, physiologic discharge  FHT: reactive and reassuring.   Prenatal labs: ABO, Rh:   Antibody:   Rubella:   RPR: NON REACTIVE (07/17 2236)  HBsAg:    HIV: Non Reactive (07/17 2236)  GBS:     Assessment/Plan: . Admit to [facility] for inpatient observation, blood pressure monitoring, fetal monitoring, 24 hr urine protein assessment. . No symptoms of preeclampsia at this time.  Reviewed in detasil . Continuous EFM and toco followed by TID if continues to be reassuring . BMZ deferred for >34 weeks . Diet: Regular . DVT Ppx: SCDs . Hx of HSV: valtrex suppressive therapy . Will monitor inpatient, obtain 24 hr urine study.  Treat BP per protocol.  Previous elevations were significantly high but now resolved to normotensive.  All questions answered.   Lyn Henri 08/09/2020, 2:16 PM

## 2020-08-10 ENCOUNTER — Observation Stay (HOSPITAL_BASED_OUTPATIENT_CLINIC_OR_DEPARTMENT_OTHER): Payer: 59

## 2020-08-10 LAB — PROTEIN, URINE, 24 HOUR
Collection Interval-UPROT: 24 hours
Protein, 24H Urine: 1751 mg/d — ABNORMAL HIGH (ref 50–100)
Protein, Urine: 103 mg/dL
Urine Total Volume-UPROT: 1700 mL

## 2020-08-10 MED ORDER — LABETALOL HCL 200 MG PO TABS: 200.0000 mg | ORAL_TABLET | Freq: Two times a day (BID) | ORAL | 1 refills | Status: AC

## 2020-08-10 MED ORDER — VALACYCLOVIR HCL 500 MG PO TABS: 500.0000 mg | ORAL_TABLET | Freq: Two times a day (BID) | ORAL | 1 refills | Status: DC

## 2020-08-10 NOTE — Progress Notes (Signed)
At bedside to check on patient, discuss plan of care.  She is hoping for discharge home.  During her stay we have discussed hypertension in pregnancy and it's spectrum.  24 hr urine protein has returned confirming proteinuria and she meets criteria for preeclampsia. She does not have severe features.   Blood pressure has been normotensive and mild range, with notable improvement on 200 mg PO labetalol BID.  We discussed continuing this dose and checking BP at home.  Will plan to obtain additional NST and discharge with plans for quick in-office follow up tomorrow for BP check, assessment, and OB US.  All questions answered.  Nilda Simmer MD

## 2020-08-10 NOTE — Progress Notes (Signed)
Antepartum Progress Note   Tiffany Kirby is a 25 y.o. female G1P0 that is admitted to [facility] for preeclampsia without severe features.  Overnight, she reports no acute events.  Admits fetal movement, denies contractions, denies leakage of fluid, denies vaginal bleeding. She denies headache, vision changes, RUQ or epigastric pain.    History   Blood pressure 132/83, pulse 64, temperature 98.1 F (36.7 C), temperature source Oral, resp. rate 18, last menstrual period 12/11/2019, SpO2 98 %. Exam  Physical Exam: Gen: Alert, well appearing, no distress Chest: nonlabored breathing CV: 1+ peripheral edema Abdomen: gravid, soft and nontender Ext: no evidence of DVT  FHT: reactive and reassuring for GA. Possible spontaneous decel vs wandering baseline, although difficult to trace due to fetal movement. Toco quiet.   Assessment/Plan: . Admitted to [facility] for preeclampsia without severe features o No symptoms of preeclampsia.  24 hr urine in progress o No severe range BP overnight. BP improved with 200 mg labetalol BID . EFM and toco q shift . Diet: Regular . DVT Ppx: SCDs . GBS positive - PCN when labor/induction . Discussed plan of care.  BP is improved and asymptomatic. She may be a candidate for outpatient management of preeclampsia. Discussed BP parameters and home BP checks. Would have her set up for in office BP check and BPP/NST.  Lyn Henri 08/10/2020, 8:18 AM

## 2020-08-10 NOTE — Progress Notes (Signed)
NST ongoing for 1 hr.  Nonreactive, but with moderate variability and no decels. Will order BPP and plan to continue observation overnight.

## 2020-08-11 DIAGNOSIS — O1493 Unspecified pre-eclampsia, third trimester: Secondary | ICD-10-CM

## 2020-08-11 DIAGNOSIS — Z3A34 34 weeks gestation of pregnancy: Secondary | ICD-10-CM

## 2020-08-11 DIAGNOSIS — O289 Unspecified abnormal findings on antenatal screening of mother: Secondary | ICD-10-CM

## 2020-08-11 DIAGNOSIS — O163 Unspecified maternal hypertension, third trimester: Secondary | ICD-10-CM

## 2020-08-11 DIAGNOSIS — O321XX0 Maternal care for breech presentation, not applicable or unspecified: Secondary | ICD-10-CM | POA: Diagnosis not present

## 2020-08-11 LAB — GC/CHLAMYDIA PROBE AMP (~~LOC~~) NOT AT ARMC
Chlamydia: POSITIVE — AB
Comment: NEGATIVE
Comment: NORMAL
Neisseria Gonorrhea: NEGATIVE

## 2020-08-11 NOTE — Discharge Summary (Signed)
Physician Discharge Summary  Patient ID: Tiffany Kirby MRN: [id number] DOB/AGE: 1995/06/02 25 y.o.  Admit date: 08/09/2020 Discharge date: 08/11/2020  Admission Diagnoses:34 weeks with Gestational HTN  Discharge Diagnoses: Same Active Problems:   Preeclampsia   Discharged Condition: good  Hospital Course: Tiffany Kirby is a 25 y.o. female G1 that presented for post-coital vaginal bleeding (now resolved). She was noted to have elevated BP and proteinuria in MAU.  She is admitted for observation for presumed preeclampsia without severe features. Bps improved on oral labetalol.  Labs c/w gestational HTN with no severe features.  Fetal well being by NST and BPP reassuring Consults: None  Significant Diagnostic Studies: proteinuria on 24 h urine and elevated Pr/Cr ratio 6.32 GBS positive  Treatments: BP monitoring and tx with meds.  Ultrasound.  BMZ  series  Discharge Exam: Blood pressure (!) 148/95, pulse 70, temperature 98 F (36.7 C), temperature source Oral, resp. rate 18, height 5\' 11"  (1.803 m), weight 104.7 kg, last menstrual period 12/11/2019, SpO2 100 %. General appearance: alert, cooperative, appears stated age and no distress Gravid nontender  Reflexes 2/4 no clonus  Disposition: Discharge disposition: 01-Home or Self Care       Discharge Instructions    Diet - low sodium heart healthy   Complete by: As directed    Discharge activity:   Complete by: As directed    Light activity with increased rest PIH warnings Give note for work to work from home FU in office for BP check  and NST later this week   Discharge diet:  No restrictions   Complete by: As directed    Increase activity slowly   Complete by: As directed      Allergies as of 08/11/2020   No Known Allergies     Medication List    STOP taking these medications   amitriptyline 10 MG tablet Commonly known as: ELAVIL   doxycycline 100 MG capsule Commonly known as: VIBRAMYCIN     TAKE these  medications   labetalol 200 MG tablet Commonly known as: NORMODYNE Take 1 tablet (200 mg total) by mouth 2 (two) times daily.   pantoprazole 40 MG tablet Commonly known as: PROTONIX Take 40 mg by mouth daily as needed.   PRENATAL ADULT GUMMY/DHA/FA PO Take 2 tablets by mouth daily.   sertraline 25 MG tablet Commonly known as: ZOLOFT Take 25 mg by mouth daily.   valACYclovir 500 MG tablet Commonly known as: VALTREX Take 1 tablet (500 mg total) by mouth 2 (two) times daily. What changed: See the new instructions.       Follow-up Information    [place] Follow up.   Why: Please follow up for appointment 08/11/20 Contact information: [address] [place] [phone]               Signed: [phone] 08/11/2020, 10:33 AM

## 2020-08-11 NOTE — Progress Notes (Signed)
Discharge instructions and prescriptions given to pt. Discussed signs and symptoms to report to the MD, upcoming appointments, and meds. Pt verbalizes understanding and has no questions at this time. Pt discharged home from hospital in stable condition.  

## 2020-08-13 NOTE — Patient Instructions (Signed)
Tiffany Kirby  08/13/2020   Your procedure is scheduled on:  08/28/2020  Arrive at 0530 at Entrance C on [address] at [facility]  and [employer]. You are invited to use the FREE valet parking or use the Visitor's parking deck.  Pick up the phone at the desk and dial [phone].  Call this number if you have problems the morning of surgery: [phone]  Remember:   Do not eat food:(After Midnight) Desps de medianoche.  Do not drink clear liquids: (After Midnight) Desps de medianoche.  Take these medicines the morning of surgery with A SIP OF WATER:  Take labetalol, zoloft and valtrex as prescribed   Do not wear jewelry, make-up or nail polish.  Do not wear lotions, powders, or perfumes. Do not wear deodorant.  Do not shave 48 hours prior to surgery.  Do not bring valuables to the hospital.  [facility] is not   responsible for any belongings or valuables brought to the hospital.  Contacts, dentures or bridgework may not be worn into surgery.  Leave suitcase in the car. After surgery it may be brought to your room.  For patients admitted to the hospital, checkout time is 11:00 AM the day of              discharge.      Please read over the following fact sheets that you were given:     Preparing for Surgery

## 2020-08-14 ENCOUNTER — Encounter (HOSPITAL_COMMUNITY): Payer: Self-pay

## 2020-08-15 ENCOUNTER — Encounter (HOSPITAL_COMMUNITY)
Admission: AD | Disposition: A | Discharge status: Home / Self Care | Payer: Self-pay | Source: Home / Self Care | Attending: Obstetrics and Gynecology

## 2020-08-15 ENCOUNTER — Inpatient Hospital Stay (HOSPITAL_COMMUNITY): Payer: 59 | Admitting: Anesthesiology

## 2020-08-15 ENCOUNTER — Encounter (HOSPITAL_COMMUNITY): Payer: Self-pay | Admitting: Obstetrics and Gynecology

## 2020-08-15 ENCOUNTER — Other Ambulatory Visit: Payer: Self-pay | Admitting: Certified Nurse Midwife

## 2020-08-15 ENCOUNTER — Inpatient Hospital Stay (HOSPITAL_COMMUNITY)
Admission: AD | Admit: 2020-08-15 | Discharge: 2020-08-18 | DRG: 787 | Disposition: A | Discharge status: Home / Self Care | Payer: 59 | Attending: Obstetrics and Gynecology | Admitting: Obstetrics and Gynecology

## 2020-08-15 ENCOUNTER — Other Ambulatory Visit: Payer: Self-pay

## 2020-08-15 DIAGNOSIS — Z3A35 35 weeks gestation of pregnancy: Secondary | ICD-10-CM

## 2020-08-15 DIAGNOSIS — O99824 Streptococcus B carrier state complicating childbirth: Secondary | ICD-10-CM | POA: Diagnosis present

## 2020-08-15 DIAGNOSIS — O321XX0 Maternal care for breech presentation, not applicable or unspecified: Secondary | ICD-10-CM | POA: Diagnosis present

## 2020-08-15 DIAGNOSIS — O368130 Decreased fetal movements, third trimester, not applicable or unspecified: Secondary | ICD-10-CM | POA: Diagnosis present

## 2020-08-15 DIAGNOSIS — O9832 Other infections with a predominantly sexual mode of transmission complicating childbirth: Secondary | ICD-10-CM | POA: Diagnosis present

## 2020-08-15 DIAGNOSIS — F32A Depression, unspecified: Secondary | ICD-10-CM | POA: Diagnosis present

## 2020-08-15 DIAGNOSIS — O1414 Severe pre-eclampsia complicating childbirth: Secondary | ICD-10-CM | POA: Diagnosis present

## 2020-08-15 DIAGNOSIS — O1413 Severe pre-eclampsia, third trimester: Secondary | ICD-10-CM | POA: Diagnosis not present

## 2020-08-15 DIAGNOSIS — Z20822 Contact with and (suspected) exposure to covid-19: Secondary | ICD-10-CM | POA: Diagnosis present

## 2020-08-15 DIAGNOSIS — A6000 Herpesviral infection of urogenital system, unspecified: Secondary | ICD-10-CM | POA: Diagnosis present

## 2020-08-15 DIAGNOSIS — O99344 Other mental disorders complicating childbirth: Secondary | ICD-10-CM | POA: Diagnosis present

## 2020-08-15 DIAGNOSIS — O368390 Maternal care for abnormalities of the fetal heart rate or rhythm, unspecified trimester, not applicable or unspecified: Secondary | ICD-10-CM

## 2020-08-15 DIAGNOSIS — A749 Chlamydial infection, unspecified: Secondary | ICD-10-CM

## 2020-08-15 LAB — PROTEIN / CREATININE RATIO, URINE
Creatinine, Urine: 112.3 mg/dL
Protein Creatinine Ratio: 3.04 mg/mg{Cre} — ABNORMAL HIGH (ref 0.00–0.15)
Total Protein, Urine: 341 mg/dL

## 2020-08-15 LAB — COMPREHENSIVE METABOLIC PANEL
ALT: 16 U/L (ref 0–44)
AST: 23 U/L (ref 15–41)
Albumin: 2.6 g/dL — ABNORMAL LOW (ref 3.5–5.0)
Alkaline Phosphatase: 147 U/L — ABNORMAL HIGH (ref 38–126)
Anion gap: 8 (ref 5–15)
BUN: 7 mg/dL (ref 6–20)
CO2: 22 mmol/L (ref 22–32)
Calcium: 8.9 mg/dL (ref 8.9–10.3)
Chloride: 106 mmol/L (ref 98–111)
Creatinine, Ser: 0.59 mg/dL (ref 0.44–1.00)
GFR, Estimated: >60 mL/min (ref >60)
Glucose, Bld: 74 mg/dL (ref 70–99)
Potassium: 3.7 mmol/L (ref 3.5–5.1)
Sodium: 136 mmol/L (ref 135–145)
Total Bilirubin: 0.5 mg/dL (ref 0.3–1.2)
Total Protein: 6.3 g/dL — ABNORMAL LOW (ref 6.5–8.1)

## 2020-08-15 LAB — RESP PANEL BY RT-PCR (FLU A&B, COVID) ARPGX2
Influenza A by PCR: NEGATIVE
Influenza B by PCR: NEGATIVE
SARS Coronavirus 2 by RT PCR: NEGATIVE

## 2020-08-15 LAB — URINALYSIS, ROUTINE W REFLEX MICROSCOPIC
Bilirubin Urine: NEGATIVE
Glucose, UA: >=500 mg/dL — AB
Hgb urine dipstick: NEGATIVE
Ketones, ur: NEGATIVE mg/dL
Leukocytes,Ua: NEGATIVE
Nitrite: NEGATIVE
Protein, ur: >=300 mg/dL — AB
Specific Gravity, Urine: 1.012 (ref 1.005–1.030)
pH: 6 (ref 5.0–8.0)

## 2020-08-15 LAB — CBC
HCT: 40.5 % (ref 36.0–46.0)
Hemoglobin: 13.5 g/dL (ref 12.0–15.0)
MCH: 29.9 pg (ref 26.0–34.0)
MCHC: 33.3 g/dL (ref 30.0–36.0)
MCV: 89.8 fL (ref 80.0–100.0)
Platelets: 186 10*3/uL (ref 150–400)
RBC: 4.51 MIL/uL (ref 3.87–5.11)
RDW: 14.3 % (ref 11.5–15.5)
WBC: 9.8 10*3/uL (ref 4.0–10.5)
nRBC: 0.7 % — ABNORMAL HIGH (ref 0.0–0.2)

## 2020-08-15 LAB — TYPE AND SCREEN
ABO/RH(D): A POS
Antibody Screen: NEGATIVE

## 2020-08-15 SURGERY — Surgical Case
Anesthesia: Spinal | Wound class: Clean Contaminated

## 2020-08-15 MED ORDER — MAGNESIUM SULFATE BOLUS VIA INFUSION
4.0000 g | Freq: Once | INTRAVENOUS | Status: AC
  Administered 2020-08-15: 4 g via INTRAVENOUS
  Filled 2020-08-15: qty 1000

## 2020-08-15 MED ORDER — LACTATED RINGERS IV SOLN: INTRAVENOUS | Status: DC

## 2020-08-15 MED ORDER — OXYTOCIN-SODIUM CHLORIDE 30-0.9 UT/500ML-% IV SOLN: 2.5000 [IU]/h | INTRAVENOUS | Status: AC

## 2020-08-15 MED ORDER — PRENATAL MULTIVITAMIN CH
1.0000 | ORAL_TABLET | Freq: Every day | ORAL | Status: DC
  Administered 2020-08-16 – 2020-08-17 (×2): 1 via ORAL
  Filled 2020-08-15 (×2): qty 1

## 2020-08-15 MED ORDER — NALOXONE HCL 4 MG/10ML IJ SOLN
1.0000 ug/kg/h | INTRAVENOUS | Status: DC | PRN
  Filled 2020-08-15: qty 5

## 2020-08-15 MED ORDER — HYDROMORPHONE HCL 1 MG/ML IJ SOLN: 0.2000 mg | INTRAMUSCULAR | Status: DC | PRN

## 2020-08-15 MED ORDER — ONDANSETRON HCL 4 MG/2ML IJ SOLN: 4.0000 mg | Freq: Three times a day (TID) | INTRAMUSCULAR | Status: DC | PRN

## 2020-08-15 MED ORDER — ZOLPIDEM TARTRATE 5 MG PO TABS: 5.0000 mg | ORAL_TABLET | Freq: Every evening | ORAL | Status: DC | PRN

## 2020-08-15 MED ORDER — KETOROLAC TROMETHAMINE 30 MG/ML IJ SOLN
30.0000 mg | Freq: Four times a day (QID) | INTRAMUSCULAR | Status: AC | PRN
  Administered 2020-08-15 – 2020-08-16 (×2): 30 mg via INTRAVENOUS
  Filled 2020-08-15: qty 1

## 2020-08-15 MED ORDER — SENNOSIDES-DOCUSATE SODIUM 8.6-50 MG PO TABS
2.0000 | ORAL_TABLET | Freq: Every day | ORAL | Status: DC
  Administered 2020-08-16 – 2020-08-18 (×3): 2 via ORAL
  Filled 2020-08-15 (×3): qty 2

## 2020-08-15 MED ORDER — NALBUPHINE HCL 10 MG/ML IJ SOLN: 5.0000 mg | Freq: Once | INTRAMUSCULAR | Status: DC | PRN

## 2020-08-15 MED ORDER — POVIDONE-IODINE 10 % EX SWAB: 2.0000 "application " | Freq: Once | CUTANEOUS | Status: DC

## 2020-08-15 MED ORDER — SIMETHICONE 80 MG PO CHEW
80.0000 mg | CHEWABLE_TABLET | Freq: Three times a day (TID) | ORAL | Status: DC
  Administered 2020-08-16 – 2020-08-18 (×6): 80 mg via ORAL
  Filled 2020-08-15 (×6): qty 1

## 2020-08-15 MED ORDER — FENTANYL CITRATE (PF) 100 MCG/2ML IJ SOLN: 25.0000 ug | INTRAMUSCULAR | Status: DC | PRN

## 2020-08-15 MED ORDER — PHENYLEPHRINE HCL-NACL 20-0.9 MG/250ML-% IV SOLN
INTRAVENOUS | Status: DC | PRN
  Administered 2020-08-15: 60 ug/min via INTRAVENOUS

## 2020-08-15 MED ORDER — MORPHINE SULFATE (PF) 0.5 MG/ML IJ SOLN
INTRAMUSCULAR | Status: AC
  Filled 2020-08-15: qty 10

## 2020-08-15 MED ORDER — MAGNESIUM SULFATE 40 GM/1000ML IV SOLN
2.0000 g/h | INTRAVENOUS | Status: AC
  Administered 2020-08-16: 2 g/h via INTRAVENOUS
  Filled 2020-08-15: qty 1000

## 2020-08-15 MED ORDER — LABETALOL HCL 5 MG/ML IV SOLN
20.0000 mg | INTRAVENOUS | Status: DC | PRN
  Administered 2020-08-15: 20 mg via INTRAVENOUS

## 2020-08-15 MED ORDER — MENTHOL 3 MG MT LOZG: 1.0000 | LOZENGE | OROMUCOSAL | Status: DC | PRN

## 2020-08-15 MED ORDER — ONDANSETRON HCL 4 MG/2ML IJ SOLN
INTRAMUSCULAR | Status: DC | PRN
  Administered 2020-08-15: 4 mg via INTRAVENOUS

## 2020-08-15 MED ORDER — NALBUPHINE HCL 10 MG/ML IJ SOLN
5.0000 mg | INTRAMUSCULAR | Status: DC | PRN
Start: 2020-08-15 — End: 2020-08-18

## 2020-08-15 MED ORDER — MORPHINE SULFATE (PF) 0.5 MG/ML IJ SOLN
INTRAMUSCULAR | Status: DC | PRN
  Administered 2020-08-15: .15 mg via INTRATHECAL

## 2020-08-15 MED ORDER — DIPHENHYDRAMINE HCL 25 MG PO CAPS: 25.0000 mg | ORAL_CAPSULE | Freq: Four times a day (QID) | ORAL | Status: DC | PRN

## 2020-08-15 MED ORDER — TETANUS-DIPHTH-ACELL PERTUSSIS 5-2.5-18.5 LF-MCG/0.5 IM SUSY: 0.5000 mL | PREFILLED_SYRINGE | Freq: Once | INTRAMUSCULAR | Status: DC

## 2020-08-15 MED ORDER — ACETAMINOPHEN 500 MG PO TABS
1000.0000 mg | ORAL_TABLET | Freq: Four times a day (QID) | ORAL | Status: DC
  Administered 2020-08-16 – 2020-08-18 (×9): 1000 mg via ORAL
  Filled 2020-08-15 (×9): qty 2

## 2020-08-15 MED ORDER — LABETALOL HCL 5 MG/ML IV SOLN
INTRAVENOUS | Status: AC
  Administered 2020-08-15: 40 mg via INTRAVENOUS
  Filled 2020-08-15: qty 4

## 2020-08-15 MED ORDER — FAMOTIDINE IN NACL 20-0.9 MG/50ML-% IV SOLN
20.0000 mg | Freq: Once | INTRAVENOUS | Status: AC
  Administered 2020-08-15: 20 mg via INTRAVENOUS
  Filled 2020-08-15: qty 50

## 2020-08-15 MED ORDER — DIPHENHYDRAMINE HCL 50 MG/ML IJ SOLN: 12.5000 mg | INTRAMUSCULAR | Status: DC | PRN

## 2020-08-15 MED ORDER — LABETALOL HCL 200 MG PO TABS
200.0000 mg | ORAL_TABLET | Freq: Two times a day (BID) | ORAL | Status: DC
  Administered 2020-08-16 – 2020-08-18 (×6): 200 mg via ORAL
  Filled 2020-08-15 (×6): qty 1

## 2020-08-15 MED ORDER — PHENYLEPHRINE HCL (PRESSORS) 10 MG/ML IV SOLN
INTRAVENOUS | Status: DC | PRN
  Administered 2020-08-15: 40 ug via INTRAVENOUS
  Administered 2020-08-15 (×2): 80 ug via INTRAVENOUS

## 2020-08-15 MED ORDER — DIPHENHYDRAMINE HCL 25 MG PO CAPS: 25.0000 mg | ORAL_CAPSULE | ORAL | Status: DC | PRN

## 2020-08-15 MED ORDER — WITCH HAZEL-GLYCERIN EX PADS: 1.0000 "application " | MEDICATED_PAD | CUTANEOUS | Status: DC | PRN

## 2020-08-15 MED ORDER — FENTANYL CITRATE (PF) 100 MCG/2ML IJ SOLN
INTRAMUSCULAR | Status: AC
  Filled 2020-08-15: qty 2

## 2020-08-15 MED ORDER — FENTANYL CITRATE (PF) 100 MCG/2ML IJ SOLN
INTRAMUSCULAR | Status: DC | PRN
  Administered 2020-08-15: 15 ug via INTRATHECAL

## 2020-08-15 MED ORDER — DIBUCAINE (PERIANAL) 1 % EX OINT: 1.0000 "application " | TOPICAL_OINTMENT | CUTANEOUS | Status: DC | PRN

## 2020-08-15 MED ORDER — ACETAMINOPHEN 500 MG PO TABS: 1000.0000 mg | ORAL_TABLET | Freq: Four times a day (QID) | ORAL | Status: DC

## 2020-08-15 MED ORDER — OXYCODONE HCL 5 MG PO TABS
5.0000 mg | ORAL_TABLET | ORAL | Status: DC | PRN
Start: 2020-08-15 — End: 2020-08-18
  Administered 2020-08-16: 5 mg via ORAL
  Administered 2020-08-17: 10 mg via ORAL
  Filled 2020-08-15: qty 1
  Filled 2020-08-15: qty 2

## 2020-08-15 MED ORDER — MAGNESIUM SULFATE 40 GM/1000ML IV SOLN
INTRAVENOUS | Status: AC
  Filled 2020-08-15: qty 1000

## 2020-08-15 MED ORDER — ONDANSETRON HCL 4 MG/2ML IJ SOLN
INTRAMUSCULAR | Status: AC
  Filled 2020-08-15: qty 2

## 2020-08-15 MED ORDER — KETOROLAC TROMETHAMINE 30 MG/ML IJ SOLN
INTRAMUSCULAR | Status: AC
  Filled 2020-08-15: qty 1

## 2020-08-15 MED ORDER — SCOPOLAMINE 1 MG/3DAYS TD PT72
MEDICATED_PATCH | TRANSDERMAL | Status: AC
  Filled 2020-08-15: qty 1

## 2020-08-15 MED ORDER — OXYTOCIN-SODIUM CHLORIDE 30-0.9 UT/500ML-% IV SOLN
INTRAVENOUS | Status: DC | PRN
  Administered 2020-08-15: 300 mL via INTRAVENOUS

## 2020-08-15 MED ORDER — LABETALOL HCL 5 MG/ML IV SOLN
40.0000 mg | INTRAVENOUS | Status: DC | PRN
  Filled 2020-08-15: qty 8

## 2020-08-15 MED ORDER — CEFAZOLIN SODIUM-DEXTROSE 2-4 GM/100ML-% IV SOLN
2.0000 g | INTRAVENOUS | Status: AC
  Administered 2020-08-15: 2 g via INTRAVENOUS
  Filled 2020-08-15: qty 100

## 2020-08-15 MED ORDER — COCONUT OIL OIL: 1.0000 "application " | TOPICAL_OIL | Status: DC | PRN

## 2020-08-15 MED ORDER — NALOXONE HCL 0.4 MG/ML IJ SOLN: 0.4000 mg | INTRAMUSCULAR | Status: DC | PRN

## 2020-08-15 MED ORDER — BUPIVACAINE IN DEXTROSE 0.75-8.25 % IT SOLN
INTRATHECAL | Status: DC | PRN
  Administered 2020-08-15: 1.5 mL via INTRATHECAL

## 2020-08-15 MED ORDER — AZITHROMYCIN 500 MG PO TABS: 1000.0000 mg | ORAL_TABLET | Freq: Once | ORAL | 0 refills | Status: DC

## 2020-08-15 MED ORDER — LABETALOL HCL 5 MG/ML IV SOLN: 80.0000 mg | INTRAVENOUS | Status: DC | PRN

## 2020-08-15 MED ORDER — ACETAMINOPHEN 10 MG/ML IV SOLN
INTRAVENOUS | Status: DC | PRN
  Administered 2020-08-15: 1000 mg via INTRAVENOUS

## 2020-08-15 MED ORDER — LACTATED RINGERS IV SOLN: INTRAVENOUS | Status: DC | PRN

## 2020-08-15 MED ORDER — SIMETHICONE 80 MG PO CHEW: 80.0000 mg | CHEWABLE_TABLET | ORAL | Status: DC | PRN

## 2020-08-15 MED ORDER — LABETALOL HCL 5 MG/ML IV SOLN
INTRAVENOUS | Status: AC
  Filled 2020-08-15: qty 4

## 2020-08-15 MED ORDER — SCOPOLAMINE 1 MG/3DAYS TD PT72
1.0000 | MEDICATED_PATCH | Freq: Once | TRANSDERMAL | Status: DC
  Administered 2020-08-15: 1.5 mg via TRANSDERMAL

## 2020-08-15 MED ORDER — SOD CITRATE-CITRIC ACID 500-334 MG/5ML PO SOLN: 30.0000 mL | ORAL | Status: DC

## 2020-08-15 MED ORDER — DEXAMETHASONE SODIUM PHOSPHATE 4 MG/ML IJ SOLN
INTRAMUSCULAR | Status: DC | PRN
  Administered 2020-08-15: 4 mg via INTRAVENOUS

## 2020-08-15 MED ORDER — HYDRALAZINE HCL 20 MG/ML IJ SOLN: 10.0000 mg | INTRAMUSCULAR | Status: DC | PRN

## 2020-08-15 MED ORDER — SERTRALINE HCL 50 MG PO TABS
25.0000 mg | ORAL_TABLET | Freq: Every day | ORAL | Status: DC
  Administered 2020-08-16 – 2020-08-18 (×3): 25 mg via ORAL
  Filled 2020-08-15 (×3): qty 1

## 2020-08-15 MED ORDER — SODIUM CHLORIDE 0.9% FLUSH: 3.0000 mL | INTRAVENOUS | Status: DC | PRN

## 2020-08-15 MED ORDER — PANTOPRAZOLE SODIUM 40 MG PO TBEC
40.0000 mg | DELAYED_RELEASE_TABLET | Freq: Every day | ORAL | Status: DC
  Administered 2020-08-16 – 2020-08-18 (×2): 40 mg via ORAL
  Filled 2020-08-15 (×2): qty 1

## 2020-08-15 MED ORDER — NALBUPHINE HCL 10 MG/ML IJ SOLN: 5.0000 mg | INTRAMUSCULAR | Status: DC | PRN

## 2020-08-15 MED ORDER — SOD CITRATE-CITRIC ACID 500-334 MG/5ML PO SOLN
30.0000 mL | Freq: Once | ORAL | Status: AC
  Administered 2020-08-15: 30 mL via ORAL
  Filled 2020-08-15: qty 15

## 2020-08-15 MED ORDER — LABETALOL HCL 5 MG/ML IV SOLN
40.0000 mg | INTRAVENOUS | Status: DC | PRN
  Administered 2020-08-15: 40 mg via INTRAVENOUS
  Filled 2020-08-15: qty 8

## 2020-08-15 MED ORDER — KETOROLAC TROMETHAMINE 30 MG/ML IJ SOLN: 30.0000 mg | Freq: Four times a day (QID) | INTRAMUSCULAR | Status: AC | PRN

## 2020-08-15 MED ORDER — MEPERIDINE HCL 25 MG/ML IJ SOLN: 6.2500 mg | INTRAMUSCULAR | Status: DC | PRN

## 2020-08-15 SURGICAL SUPPLY — 37 items
BENZOIN TINCTURE PRP APPL 2/3 (GAUZE/BANDAGES/DRESSINGS) ×2 IMPLANT
CHLORAPREP W/TINT 26ML (MISCELLANEOUS) ×2 IMPLANT
CLAMP CORD UMBIL (MISCELLANEOUS) IMPLANT
CLOSURE STERI STRIP 1/2 X4 (GAUZE/BANDAGES/DRESSINGS) ×2 IMPLANT
CLOTH BEACON ORANGE TIMEOUT ST (SAFETY) ×2 IMPLANT
CLSR STERI-STRIP ANTIMIC 1/2X4 (GAUZE/BANDAGES/DRESSINGS) ×2 IMPLANT
DRSG OPSITE POSTOP 4X10 (GAUZE/BANDAGES/DRESSINGS) ×2 IMPLANT
ELECT REM PT RETURN 9FT ADLT (ELECTROSURGICAL) ×2
ELECTRODE REM PT RTRN 9FT ADLT (ELECTROSURGICAL) ×1 IMPLANT
EXTRACTOR VACUUM KIWI (MISCELLANEOUS) IMPLANT
GLOVE BIO SURGEON STRL SZ 6.5 (GLOVE) ×2 IMPLANT
GLOVE BIOGEL PI IND STRL 6.5 (GLOVE) ×1 IMPLANT
GLOVE BIOGEL PI IND STRL 7.0 (GLOVE) ×2 IMPLANT
GLOVE BIOGEL PI INDICATOR 6.5 (GLOVE) ×1
GLOVE BIOGEL PI INDICATOR 7.0 (GLOVE) ×2
GOWN STRL REUS W/TWL LRG LVL3 (GOWN DISPOSABLE) ×4 IMPLANT
KIT ABG SYR 3ML LUER SLIP (SYRINGE) ×2 IMPLANT
NEEDLE HYPO 25X5/8 SAFETYGLIDE (NEEDLE) ×2 IMPLANT
NS IRRIG 1000ML POUR BTL (IV SOLUTION) ×2 IMPLANT
PACK C SECTION WH (CUSTOM PROCEDURE TRAY) ×2 IMPLANT
PAD ABD 8X10 STRL (GAUZE/BANDAGES/DRESSINGS) ×2 IMPLANT
PAD OB MATERNITY 4.3X12.25 (PERSONAL CARE ITEMS) ×2 IMPLANT
PENCIL SMOKE EVAC W/HOLSTER (ELECTROSURGICAL) ×2 IMPLANT
SPONGE GAUZE 4X4 12PLY STER LF (GAUZE/BANDAGES/DRESSINGS) ×2 IMPLANT
SUT PDS AB 0 CTX 60 (SUTURE) ×2 IMPLANT
SUT PLAIN 0 NONE (SUTURE) IMPLANT
SUT PLAIN 2 0 (SUTURE) ×1
SUT PLAIN 2 0 XLH (SUTURE) ×2 IMPLANT
SUT PLAIN ABS 2-0 CT1 27XMFL (SUTURE) ×1 IMPLANT
SUT VIC AB 0 CT1 36 (SUTURE) ×2 IMPLANT
SUT VIC AB 0 CTX 36 (SUTURE) ×2
SUT VIC AB 0 CTX36XBRD ANBCTRL (SUTURE) ×2 IMPLANT
SUT VIC AB 4-0 PS2 27 (SUTURE) ×2 IMPLANT
TAPE MEDIFIX FOAM 3 (GAUZE/BANDAGES/DRESSINGS) ×2 IMPLANT
TOWEL OR 17X24 6PK STRL BLUE (TOWEL DISPOSABLE) ×2 IMPLANT
TRAY FOLEY W/BAG SLVR 14FR LF (SET/KITS/TRAYS/PACK) IMPLANT
WATER STERILE IRR 1000ML POUR (IV SOLUTION) ×2 IMPLANT

## 2020-08-15 NOTE — Anesthesia Postprocedure Evaluation (Signed)
Anesthesia Post Note  Patient: Tiffany Kirby  Procedure(s) Performed: PRIMARY CESAREAN SECTION EDC:09-16-20 ALLERG: NKDA (N/A )     Patient location during evaluation: PACU Anesthesia Type: Spinal Level of consciousness: awake and alert and oriented Pain management: pain level controlled Vital Signs Assessment: post-procedure vital signs reviewed and stable Respiratory status: spontaneous breathing, nonlabored ventilation and respiratory function stable Cardiovascular status: blood pressure returned to baseline and stable Postop Assessment: no headache, no backache, spinal receding, patient able to bend at knees and no apparent nausea or vomiting Anesthetic complications: no Comments: Hypertensive at baseline 2/2 preE, magnesium being started in PACU   No complications documented.  Last Vitals:  Vitals:   08/15/20 2000 08/15/20 2015  BP: (!) 162/92 (!) 151/98  Pulse: 63 62  Resp: 16 14  Temp:    SpO2: 98% 99%    Last Pain:  Vitals:   08/15/20 2015  TempSrc:   PainSc: 2    Pain Goal: Patients Stated Pain Goal: 1 (08/15/20 2015)              Epidural/Spinal Function Cutaneous sensation: Able to Wiggle Toes (08/15/20 2015), Patient able to flex knees: Yes (08/15/20 2015), Patient able to lift hips off bed: No (08/15/20 2015), Back pain beyond tenderness at insertion site: No (08/15/20 2015), Progressively worsening motor and/or sensory loss: No (08/15/20 2015), Bowel and/or bladder incontinence post epidural: No (08/15/20 2015)  Lannie Fields

## 2020-08-15 NOTE — Brief Op Note (Signed)
08/15/2020  7:43 PM  PATIENT:  Tiffany Kirby  25 y.o. female  PRE-OPERATIVE DIAGNOSIS:  BREECH & Preeclampsia  POST-OPERATIVE DIAGNOSIS:  Breech, preeclampsia with severe features  PROCEDURE:  Procedure(s) with comments: PRIMARY CESAREAN SECTION EDC:09-16-20 ALLERG: NKDA (N/A) - Breech And Preeclampsia  SURGEON:  Surgeon(s) and Role:    * Harold Hedge, MD - Primary  PHYSICIAN ASSISTANT:   ASSISTANTS: none   ANESTHESIA:   spinal  EBL:  Per anesthesiology note   BLOOD ADMINISTERED:none  DRAINS: Urinary Catheter (Foley)   LOCAL MEDICATIONS USED:  NONE  SPECIMEN:  Source of Specimen:  placenta  DISPOSITION OF SPECIMEN:  PATHOLOGY  COUNTS:  YES  TOURNIQUET:  * No tourniquets in log *  DICTATION: .Other Dictation: Dictation Number [id number]  PLAN OF CARE: Admit to inpatient   PATIENT DISPOSITION:  PACU - hemodynamically stable.   Delay start of Pharmacological VTE agent (>24hrs) due to surgical blood loss or risk of bleeding: not applicable

## 2020-08-15 NOTE — H&P (Signed)
Tiffany Kirby is a 25 y.o. female presenting for preeclampsia. She has been followed for preeclampsia without severe features. She has been taking po labetalol. C/O HA a couple of days ago. Today was evaluated in office where she C/O increasing fatigue and not feeling well. Also proteinuria increased to 4+. Developed blurry vision on the way to hospital today. U/S in office today noted breech and BPP 8/8. No epigastric pain. 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 Noted low pappA. OB History    Gravida  1   Para      Term      Preterm      AB      Living        SAB      IAB      Ectopic      Multiple      Live Births             Past Medical History:  Diagnosis Date  . Anxiety   . Depression   . Headache   . Herpes 2020  . Interstitial cystitis   . Vaginal Pap smear, abnormal    Past Surgical History:  Procedure Laterality Date  . NO PAST SURGERIES     Family History: family history includes Anxiety disorder in her maternal grandmother; Arthritis in her maternal grandmother; Depression in her mother; Drug abuse in her mother; Heart disease in her mother; Hypertension in her maternal grandmother. Social History:  reports that she has never smoked. She has never used smokeless tobacco. She reports previous alcohol use. She reports previous drug use. Drug: Marijuana.     Maternal Diabetes: No Genetic Screening: Abnormal:  Results: Other:as above Maternal Ultrasounds/Referrals: Normal Fetal Ultrasounds or other Referrals:  Referred to Materal Fetal Medicine  Maternal Substance Abuse:  No Significant Maternal Medications:  Meds include: Other: labetalol Significant Maternal Lab Results:  Group B Strep positive Other Comments:  None  Review of Systems  Constitutional: Negative for  fever.  Eyes: Positive for visual disturbance.  Gastrointestinal: Negative for abdominal pain.  Neurological: Negative for headaches.   Maternal Medical History:  Fetal activity: Perceived fetal activity is normal.        Blood pressure (!) 166/107, pulse 81, temperature 97.8 F (36.6 C), temperature source Oral, resp. rate 20, last menstrual period 12/11/2019, SpO2 98 %. Maternal Exam:  Abdomen: Fetal presentation: breech     Physical Exam Cardiovascular:     Rate and Rhythm: Normal rate.  Pulmonary:     Effort: Pulmonary effort is normal.  Neurological:     Comments: DTR 2+ without clonus     FHT with accels. Single decel to 90s that resolved with position change. Prenatal labs: ABO, Rh: --/--/PENDING (05/13 1647) Antibody: PENDING (05/13 1647) Rubella: Immune (11/12 0000) RPR: Nonreactive (11/12 0000)  HBsAg: Negative (11/12 0000)  HIV: Non-reactive (11/12 0000)  GBS: POSITIVE/-- (05/07 1511)    Results for orders placed or performed during the hospital encounter of 08/15/20 (from the past 24 hour(s))  CBC     Status: Abnormal   Collection Time: 08/15/20  3:42 PM  Result Value Ref Range   WBC 9.8 4.0 - 10.5 K/uL   RBC 4.51 3.87 - 5.11 MIL/uL   Hemoglobin 13.5 12.0 - 15.0 g/dL   HCT 55.3 74.8 - 27.0 %   MCV 89.8 80.0 -  100.0 fL   MCH 29.9 26.0 - 34.0 pg   MCHC 33.3 30.0 - 36.0 g/dL   RDW 71.2 45.8 - 09.9 %   Platelets 186 150 - 400 K/uL   nRBC 0.7 (H) 0.0 - 0.2 %  Comprehensive metabolic panel     Status: Abnormal   Collection Time: 08/15/20  3:42 PM  Result Value Ref Range   Sodium 136 135 - 145 mmol/L   Potassium 3.7 3.5 - 5.1 mmol/L   Chloride 106 98 - 111 mmol/L   CO2 22 22 - 32 mmol/L   Glucose, Bld 74 70 - 99 mg/dL   BUN 7 6 - 20 mg/dL   Creatinine, Ser 8.33 0.44 - 1.00 mg/dL   Calcium 8.9 8.9 - 82.5 mg/dL   Total Protein 6.3 (L) 6.5 - 8.1 g/dL   Albumin 2.6 (L) 3.5 - 5.0 g/dL   AST 23 15 - 41 U/L   ALT 16 0 - 44 U/L   Alkaline Phosphatase  147 (H) 38 - 126 U/L   Total Bilirubin 0.5 0.3 - 1.2 mg/dL   GFR, Estimated >05 >39 mL/min   Anion gap 8 5 - 15  Protein / creatinine ratio, urine     Status: None (Preliminary result)   Collection Time: 08/15/20  4:04 PM  Result Value Ref Range   Creatinine, Urine 112.30 mg/dL   Total Protein, Urine PENDING mg/dL   Protein Creatinine Ratio PENDING 0.00 - 0.15 mg/mg[Cre]  Urinalysis, Routine w reflex microscopic Urine, Clean Catch     Status: Abnormal   Collection Time: 08/15/20  4:04 PM  Result Value Ref Range   Color, Urine YELLOW YELLOW   APPearance HAZY (A) CLEAR   Specific Gravity, Urine 1.012 1.005 - 1.030   pH 6.0 5.0 - 8.0   Glucose, UA >=500 (A) NEGATIVE mg/dL   Hgb urine dipstick NEGATIVE NEGATIVE   Bilirubin Urine NEGATIVE NEGATIVE   Ketones, ur NEGATIVE NEGATIVE mg/dL   Protein, ur >=767 (A) NEGATIVE mg/dL   Nitrite NEGATIVE NEGATIVE   Leukocytes,Ua NEGATIVE NEGATIVE   RBC / HPF 0-5 0 - 5 RBC/hpf   WBC, UA 0-5 0 - 5 WBC/hpf   Bacteria, UA RARE (A) NONE SEEN   Squamous Epithelial / LPF 0-5 0 - 5   Mucus PRESENT   Type and screen     Status: None (Preliminary result)   Collection Time: 08/15/20  4:47 PM  Result Value Ref Range   ABO/RH(D) PENDING    Antibody Screen PENDING    Sample Expiration      08/18/2020,2359 Performed at [facility], [address], [place], [place] [address]    Assessment/Plan: 25 yo G1P0 at 12 3/7 wks Preeclampsia with severe features - blurry vision and severe range BP on oral labetalol. Will treat BP with IV labetalol per protocol Recommend delivery. D/W cesarean section and risks including infection, organ damage, bleeding/transfusion-HIV/Hep, DVT/PE, pneumonia, wound breakdown. Also D/W PP magnesium sulfate infusion for Sz prophylaxis. All questions answered. She states she understands and agrees.    Roselle Locus II 08/15/2020, 5:15 PM

## 2020-08-15 NOTE — Lactation Note (Signed)
This note was copied from a baby's chart. Lactation Consultation Note  Patient Name: Tiffany Kirby UUVOZ'[id number] Date: 08/15/2020 Reason for consult: Initial assessment;Mother's request;Difficult latch;Primapara;1st time breastfeeding;Late-preterm 34-36.6wks;Infant < 6lbs GHTN on Mag  Age:24 hours  LC received a call that infant needed assistance with latching following a low glucose. LC assisted with latching infant on both breasts for 12 minutes with signs of milk transfer.   Mom's nipples are erect and large but infant able to latch successfully.  LC reviewed with mother how to reduce calorie loss for LPTI and keep total feedings under 30 minutes.  LC talked with Mom about choices for supplementation including dBM. Mom decided on formula LC alerted RN to come in provided further information on choices.   LC reviewed with Mom paced bottle feeding following latching at the breasts and extra slow flow nipple provided.  Mom aware supplementation volume for first 24 hrs (5-73ml)  Mom to offer any EBM first followed by formula.   Plan 1. To feed based on cues 8-12x in 24 hr period no more than 3 hrs without an attempt. Mom to offer breasts and look for swallows with breast compression.           2. Paced bottle feed EBM , formula according to LPTI supplementation guidelines reviewed.           3 Mom to pump DEBP q 3hrs for .             4. I and O sheet reviewed.             5. LC brochure of inpatient and outpatient services reviewed.   All questions answered at the end of the visit.    Maternal Data Has patient been taught Hand Expression?: Yes Does the patient have breastfeeding experience prior to this delivery?: No  Feeding Mother's Current Feeding Choice: Breast Milk and Formula  LATCH Score Latch: Repeated attempts needed to sustain latch, nipple held in mouth throughout feeding, stimulation needed to elicit sucking reflex.  Audible Swallowing: A few with  stimulation  Type of Nipple: Everted at rest and after stimulation  Comfort (Breast/Nipple): Soft / non-tender  Hold (Positioning): Assistance needed to correctly position infant at breast and maintain latch.  LATCH Score: 7   Lactation Tools Discussed/Used Tools: Pump;Flanges Flange Size: 24 Breast pump type: Double-Electric Breast Pump Pump Education: Setup, frequency, and cleaning Reason for Pumping: increase stimulation Pumping frequency: every 3 hrs for 15 minutes  Interventions Interventions: Breast feeding basics reviewed;Support pillows;Education;Assisted with latch;Position options;Skin to skin;Expressed milk;Hand express;DEBP;Breast compression  Discharge WIC Program: Yes  Consult Status Consult Status: Follow-up Date: 08/16/20 Follow-up type: In-patient    Maurie Musco  Nicholson-Springer 08/15/2020, 11:15 PM

## 2020-08-15 NOTE — MAU Note (Signed)
Sent from MD office for evaluation for worsening Preeclampsia.  Denies H/A and epigastric pain, but has blurred vision.  Reports on Labetalol, last took this morning @ 0900.  Endorses +FM, but not as much as usual, had NST @ office, "baby not as active as he should be".  Denies LOF or VB.

## 2020-08-15 NOTE — Anesthesia Procedure Notes (Signed)
Spinal  Patient location during procedure: OR Start time: 08/15/2020 6:36 PM End time: 08/15/2020 6:41 PM Reason for block: surgical anesthesia Staffing Performed: anesthesiologist  Anesthesiologist: Lannie Fields, DO Preanesthetic Checklist Completed: patient identified, IV checked, risks and benefits discussed, surgical consent, monitors and equipment checked, pre-op evaluation and timeout performed Spinal Block Patient position: sitting Prep: DuraPrep and site prepped and draped Patient monitoring: cardiac monitor, continuous pulse ox and blood pressure Approach: midline Location: L3-4 Injection technique: single-shot Needle Needle type: Pencan  Needle gauge: 24 G Needle length: 9 cm Assessment Sensory level: T6 Events: CSF return Additional Notes Functioning IV was confirmed and monitors were applied. Sterile prep and drape, including hand hygiene and sterile gloves were used. The patient was positioned and the spine was prepped. The skin was anesthetized with lidocaine.  Free flow of clear CSF was obtained prior to injecting local anesthetic into the CSF.  The spinal needle aspirated freely following injection.  The needle was carefully withdrawn.  The patient tolerated the procedure well.

## 2020-08-15 NOTE — Transfer of Care (Signed)
Immediate Anesthesia Transfer of Care Note  Patient: Tiffany Kirby  Procedure(s) Performed: PRIMARY CESAREAN SECTION EDC:09-16-20 ALLERG: NKDA (N/A )  Patient Location: PACU  Anesthesia Type:Spinal  Level of Consciousness: awake, alert  and oriented  Airway & Oxygen Therapy: Patient Spontanous Breathing  Post-op Assessment: Report given to RN and Post -op Vital signs reviewed and stable  Post vital signs: Reviewed and stable  Last Vitals:  Vitals Value Taken Time  BP 147/104 08/15/20 1953  Temp    Pulse 63 08/15/20 1955  Resp 14 08/15/20 1955  SpO2 99 % 08/15/20 1955  Vitals shown include unvalidated device data.  Last Pain:  Vitals:   08/15/20 1519  TempSrc: Oral         Complications: No complications documented.

## 2020-08-15 NOTE — Anesthesia Preprocedure Evaluation (Addendum)
Anesthesia Evaluation  Patient identified by MRN, date of birth, ID band Patient awake    Reviewed: Allergy & Precautions, NPO status , Patient's Chart, lab work & pertinent test results, reviewed documented beta blocker date and time   Airway Mallampati: II  TM Distance: >3 FB Neck ROM: Full    Dental  (+) Dental Advisory Given   Pulmonary neg pulmonary ROS,    breath sounds clear to auscultation       Cardiovascular hypertension,  Rhythm:Regular Rate:Normal  HTN 2/2 preE- 158/105 on admission, being treated w/ IV labetalol    Neuro/Psych  Headaches, PSYCHIATRIC DISORDERS Anxiety Depression    GI/Hepatic negative GI ROS, Neg liver ROS,   Endo/Other  negative endocrine ROS  Renal/GU negative Renal ROS  negative genitourinary   Musculoskeletal   Abdominal   Peds  Hematology negative hematology ROS (+)   Anesthesia Other Findings   Reproductive/Obstetrics (+) Pregnancy (breech. Pre-E )                            Lab Results  Component Value Date   WBC 9.8 08/15/2020   HGB 13.5 08/15/2020   HCT 40.5 08/15/2020   MCV 89.8 08/15/2020   PLT 186 08/15/2020   Lab Results  Component Value Date   CREATININE 0.59 08/15/2020   BUN 7 08/15/2020   NA 136 08/15/2020   K 3.7 08/15/2020   CL 106 08/15/2020   CO2 22 08/15/2020    Anesthesia Physical Anesthesia Plan  ASA: III  Anesthesia Plan: Spinal   Post-op Pain Management:    Induction:   PONV Risk Score and Plan: 2 and Dexamethasone, Ondansetron and Treatment may vary due to age or medical condition  Airway Management Planned: Natural Airway  Additional Equipment: None  Intra-op Plan:   Post-operative Plan:   Informed Consent: I have reviewed the patients History and Physical, chart, labs and discussed the procedure including the risks, benefits and alternatives for the proposed anesthesia with the patient or authorized  representative who has indicated his/her understanding and acceptance.       Plan Discussed with: CRNA  Anesthesia Plan Comments:        Anesthesia Quick Evaluation

## 2020-08-15 NOTE — MAU Provider Note (Signed)
History     CSN: [id number]  Arrival date and time: 08/15/20 1504   Event Date/Time   First Provider Initiated Contact with Patient 08/15/20 1613      Chief Complaint  Patient presents with  . BP Evaluation   HPI Tiffany Kirby is a 25 y.o. G1P0 at [redacted]w[redacted]d who presents from the office for preeclampsia evaluation. Was admitted due to preeclampsia diagnosis last week; received BMZ & was discharged on labetalol. Was in the office today & BPs were still elevated. Reports headache a few days ago but not currently. Noted blurred vision that lasted a few hours while she was walking around [employer] earlier today. Denies epigastric pain. Decreased fetal movement today. Has only felt baby move once since coming to MAU. States she is supposed to have a c/section b/c her baby is breech.  States she was told baby's tracing was not reactive in the office. She states she had a BPP in the office but is unaware of the results.   OB History    Gravida  1   Para      Term      Preterm      AB      Living        SAB      IAB      Ectopic      Multiple      Live Births              Past Medical History:  Diagnosis Date  . Anxiety   . Depression   . Headache   . Herpes 2020  . Interstitial cystitis   . Vaginal Pap smear, abnormal     Past Surgical History:  Procedure Laterality Date  . NO PAST SURGERIES      Family History  Problem Relation Age of Onset  . Depression Mother   . Drug abuse Mother   . Heart disease Mother   . Anxiety disorder Maternal Grandmother   . Arthritis Maternal Grandmother   . Hypertension Maternal Grandmother     Social History   Tobacco Use  . Smoking status: Never Smoker  . Smokeless tobacco: Never Used  Vaping Use  . Vaping Use: Former  . Substances: Nicotine, Flavoring  Substance Use Topics  . Alcohol use: Not Currently  . Drug use: Not Currently    Types: Marijuana    Allergies: No Known Allergies  Medications Prior to Admission   Medication Sig Dispense Refill Last Dose  . pantoprazole (PROTONIX) 40 MG tablet Take 40 mg by mouth daily as needed.   08/15/2020 at Unknown time  . Prenatal MV & Min w/FA-DHA (PRENATAL ADULT GUMMY/DHA/FA PO) Take 2 tablets by mouth daily.   08/15/2020 at Unknown time  . sertraline (ZOLOFT) 25 MG tablet Take 25 mg by mouth daily.   08/15/2020 at Unknown time  . valACYclovir (VALTREX) 500 MG tablet Take 1 tablet (500 mg total) by mouth 2 (two) times daily. 60 tablet 1 08/15/2020 at Unknown time  . azithromycin (ZITHROMAX) 500 MG tablet Take 2 tablets (1,000 mg total) by mouth once for 1 dose. 2 tablet 0   . labetalol (NORMODYNE) 200 MG tablet Take 1 tablet (200 mg total) by mouth 2 (two) times daily. 60 tablet 1     Review of Systems  Constitutional: Negative.   Eyes: Positive for visual disturbance.  Gastrointestinal: Negative.   Genitourinary: Negative.   Neurological: Negative for headaches.   Physical Exam   Blood pressure Marland Kitchen)  166/107, pulse 81, temperature 97.8 F (36.6 C), temperature source Oral, resp. rate 20, last menstrual period 12/11/2019, SpO2 98 %.  Patient Vitals for the past 24 hrs:  BP Temp Temp src Pulse Resp SpO2  08/15/20 1701 (!) 166/107 -- -- 81 -- --  08/15/20 1646 (!) 146/97 -- -- 91 -- --  08/15/20 1616 (!) 147/104 -- -- 91 -- --  08/15/20 1601 (!) 137/104 -- -- 96 -- --  08/15/20 1546 140/89 -- -- 96 -- --  08/15/20 1532 (!) 151/102 -- -- 95 -- --  08/15/20 1519 (!) 149/108 97.8 F (36.6 C) Oral 89 20 98 %    Physical Exam Vitals and nursing note reviewed.  Constitutional:      General: She is not in acute distress.    Appearance: Normal appearance.  HENT:     Head: Normocephalic and atraumatic.  Eyes:     General: No scleral icterus. Pulmonary:     Effort: Pulmonary effort is normal. No respiratory distress.  Abdominal:     Palpations: Abdomen is soft.     Tenderness: There is no abdominal tenderness.  Skin:    General: Skin is warm and dry.   Neurological:     Mental Status: She is alert.  Psychiatric:        Mood and Affect: Mood normal.        Behavior: Behavior normal.    NST:  Baseline: 150 bpm, Variability: Good {> 6 bpm), Accelerations: 10x10 and Decelerations: spontaneous decel down to the 80s that lasted for ~90 seconds  MAU Course  Procedures Results for orders placed or performed during the hospital encounter of 08/15/20 (from the past 24 hour(s))  CBC     Status: Abnormal   Collection Time: 08/15/20  3:42 PM  Result Value Ref Range   WBC 9.8 4.0 - 10.5 K/uL   RBC 4.51 3.87 - 5.11 MIL/uL   Hemoglobin 13.5 12.0 - 15.0 g/dL   HCT 16.1 09.6 - 04.5 %   MCV 89.8 80.0 - 100.0 fL   MCH 29.9 26.0 - 34.0 pg   MCHC 33.3 30.0 - 36.0 g/dL   RDW 40.9 81.1 - 91.4 %   Platelets 186 150 - 400 K/uL   nRBC 0.7 (H) 0.0 - 0.2 %  Comprehensive metabolic panel     Status: Abnormal   Collection Time: 08/15/20  3:42 PM  Result Value Ref Range   Sodium 136 135 - 145 mmol/L   Potassium 3.7 3.5 - 5.1 mmol/L   Chloride 106 98 - 111 mmol/L   CO2 22 22 - 32 mmol/L   Glucose, Bld 74 70 - 99 mg/dL   BUN 7 6 - 20 mg/dL   Creatinine, Ser 7.82 0.44 - 1.00 mg/dL   Calcium 8.9 8.9 - 95.6 mg/dL   Total Protein 6.3 (L) 6.5 - 8.1 g/dL   Albumin 2.6 (L) 3.5 - 5.0 g/dL   AST 23 15 - 41 U/L   ALT 16 0 - 44 U/L   Alkaline Phosphatase 147 (H) 38 - 126 U/L   Total Bilirubin 0.5 0.3 - 1.2 mg/dL   GFR, Estimated >21 >30 mL/min   Anion gap 8 5 - 15  Type and screen     Status: None (Preliminary result)   Collection Time: 08/15/20  4:47 PM  Result Value Ref Range   ABO/RH(D) PENDING    Antibody Screen PENDING    Sample Expiration      08/18/2020,2359 Performed at [facility], [address], [place], [place] [address]     MDM Elevated BPs in MAU but not severe range & pt currently asymptomatic. Preeclampsia labs in process. Will keep NPO as patient will go for c/section if delivery is warranted.   Discussed fetal movement &  tracing with patient. Per patient, she had non reactive tracing in the office; and unsure of BPP results.  I contacted Dr. Henderson Cloud to obtain BPP results - fetal deceleration occurred while I was speaking with him. Dr. Henderson Cloud will come see patient.   Decel improved with maternal position change. IV ordered. Covid swab ordered. Type & screen collected.   Assessment and Plan   1. Severe pre-eclampsia in third trimester   2. Maternal care for fetal decelerations during pregnancy   3. [redacted] weeks gestation of pregnancy    -Dr. Henderson Cloud on unit to manage patient's care  Judeth Horn 08/15/2020, 5:09 PM

## 2020-08-15 NOTE — MAU Note (Signed)
Notified Dr. Henderson Cloud of second severe range b/p. Stated he would put in protocol orders.  Next b/p was 158/108 ( not severe range. Held off giving labetalol.  1746 B/P elevated again 167/108. Labetalol protocol had not been put in.   Called Dr. Henderson Cloud again and he said he would put it in.  When I went to get it out of pyxis orser still not in. So I took as verbal and placed orde in epic an over rode pyxis to get labetalol out.  Gave firest dose of Labetalol 20 mg at 1810. Second dose at 1825 and pt up to OR with b/p down to 150/103. Notified Colieen, CRNA of b/p and dosed of labetalol.

## 2020-08-16 LAB — COMPREHENSIVE METABOLIC PANEL
ALT: 16 U/L (ref 0–44)
AST: 30 U/L (ref 15–41)
Albumin: 2.4 g/dL — ABNORMAL LOW (ref 3.5–5.0)
Alkaline Phosphatase: 126 U/L (ref 38–126)
Anion gap: 8 (ref 5–15)
BUN: 6 mg/dL (ref 6–20)
CO2: 21 mmol/L — ABNORMAL LOW (ref 22–32)
Calcium: 8.4 mg/dL — ABNORMAL LOW (ref 8.9–10.3)
Chloride: 103 mmol/L (ref 98–111)
Creatinine, Ser: 0.7 mg/dL (ref 0.44–1.00)
GFR, Estimated: >60 mL/min (ref >60)
Glucose, Bld: 145 mg/dL — ABNORMAL HIGH (ref 70–99)
Potassium: 4.2 mmol/L (ref 3.5–5.1)
Sodium: 132 mmol/L — ABNORMAL LOW (ref 135–145)
Total Bilirubin: 0.5 mg/dL (ref 0.3–1.2)
Total Protein: 5.7 g/dL — ABNORMAL LOW (ref 6.5–8.1)

## 2020-08-16 LAB — CBC
HCT: 37.9 % (ref 36.0–46.0)
Hemoglobin: 12.5 g/dL (ref 12.0–15.0)
MCH: 29.7 pg (ref 26.0–34.0)
MCHC: 33 g/dL (ref 30.0–36.0)
MCV: 90 fL (ref 80.0–100.0)
Platelets: 172 10*3/uL (ref 150–400)
RBC: 4.21 MIL/uL (ref 3.87–5.11)
RDW: 14.4 % (ref 11.5–15.5)
WBC: 13.4 10*3/uL — ABNORMAL HIGH (ref 4.0–10.5)
nRBC: 0.3 % — ABNORMAL HIGH (ref 0.0–0.2)

## 2020-08-16 LAB — MAGNESIUM: Magnesium: 4.5 mg/dL — ABNORMAL HIGH (ref 1.7–2.4)

## 2020-08-16 NOTE — Progress Notes (Signed)
Subjective: Postpartum Day 1: Cesarean Delivery Patient reports tolerating PO and + flatus.    Objective: Vital signs in last 24 hours: Temp:  [97.3 F (36.3 C)-98.5 F (36.9 C)] 97.9 F (36.6 C) (05/14 0725) Pulse Rate:  [62-171] 90 (05/14 0725) Resp:  [12-20] 18 (05/14 0725) BP: (106-168)/(64-116) 128/85 (05/14 0725) SpO2:  [98 %-100 %] 100 % (05/14 0725)  Physical Exam:  General: alert, cooperative and no distress Lochia: appropriate Uterine Fundus: firm Incision: healing well DVT Evaluation: No evidence of DVT seen on physical exam. DTR 2+ Magnesium infusing Recent Labs    08/15/20 1542 08/16/20 0205  HGB 13.5 12.5  HCT 40.5 37.9     Results for orders placed or performed during the hospital encounter of 08/15/20 (from the past 24 hour(s))  CBC     Status: Abnormal   Collection Time: 08/15/20  3:42 PM  Result Value Ref Range   WBC 9.8 4.0 - 10.5 K/uL   RBC 4.51 3.87 - 5.11 MIL/uL   Hemoglobin 13.5 12.0 - 15.0 g/dL   HCT 17.5 10.2 - 58.5 %   MCV 89.8 80.0 - 100.0 fL   MCH 29.9 26.0 - 34.0 pg   MCHC 33.3 30.0 - 36.0 g/dL   RDW 27.7 82.4 - 23.5 %   Platelets 186 150 - 400 K/uL   nRBC 0.7 (H) 0.0 - 0.2 %  Comprehensive metabolic panel     Status: Abnormal   Collection Time: 08/15/20  3:42 PM  Result Value Ref Range   Sodium 136 135 - 145 mmol/L   Potassium 3.7 3.5 - 5.1 mmol/L   Chloride 106 98 - 111 mmol/L   CO2 22 22 - 32 mmol/L   Glucose, Bld 74 70 - 99 mg/dL   BUN 7 6 - 20 mg/dL   Creatinine, Ser 3.61 0.44 - 1.00 mg/dL   Calcium 8.9 8.9 - 44.3 mg/dL   Total Protein 6.3 (L) 6.5 - 8.1 g/dL   Albumin 2.6 (L) 3.5 - 5.0 g/dL   AST 23 15 - 41 U/L   ALT 16 0 - 44 U/L   Alkaline Phosphatase 147 (H) 38 - 126 U/L   Total Bilirubin 0.5 0.3 - 1.2 mg/dL   GFR, Estimated >15 >40 mL/min   Anion gap 8 5 - 15  Protein / creatinine ratio, urine     Status: Abnormal   Collection Time: 08/15/20  4:04 PM  Result Value Ref Range   Creatinine, Urine 112.30 mg/dL    Total Protein, Urine 341 mg/dL   Protein Creatinine Ratio 3.04 (H) 0.00 - 0.15 mg/mg[Cre]  Urinalysis, Routine w reflex microscopic Urine, Clean Catch     Status: Abnormal   Collection Time: 08/15/20  4:04 PM  Result Value Ref Range   Color, Urine YELLOW YELLOW   APPearance HAZY (A) CLEAR   Specific Gravity, Urine 1.012 1.005 - 1.030   pH 6.0 5.0 - 8.0   Glucose, UA >=500 (A) NEGATIVE mg/dL   Hgb urine dipstick NEGATIVE NEGATIVE   Bilirubin Urine NEGATIVE NEGATIVE   Ketones, ur NEGATIVE NEGATIVE mg/dL   Protein, ur >=086 (A) NEGATIVE mg/dL   Nitrite NEGATIVE NEGATIVE   Leukocytes,Ua NEGATIVE NEGATIVE   RBC / HPF 0-5 0 - 5 RBC/hpf   WBC, UA 0-5 0 - 5 WBC/hpf   Bacteria, UA RARE (A) NONE SEEN   Squamous Epithelial / LPF 0-5 0 - 5   Mucus PRESENT   Type and screen     Status: None  Collection Time: 08/15/20  4:47 PM  Result Value Ref Range   ABO/RH(D) A POS    Antibody Screen NEG    Sample Expiration      08/18/2020,2359 Performed at [facility], [address], [place], [place] [address]   Resp Panel by RT-PCR (Flu A&B, Covid) Nasopharyngeal Swab     Status: None   Collection Time: 08/15/20  4:47 PM   Specimen: Nasopharyngeal Swab; Nasopharyngeal(NP) swabs in vial transport medium  Result Value Ref Range   SARS Coronavirus 2 by RT PCR NEGATIVE NEGATIVE   Influenza A by PCR NEGATIVE NEGATIVE   Influenza B by PCR NEGATIVE NEGATIVE  CBC     Status: Abnormal   Collection Time: 08/16/20  2:05 AM  Result Value Ref Range   WBC 13.4 (H) 4.0 - 10.5 K/uL   RBC 4.21 3.87 - 5.11 MIL/uL   Hemoglobin 12.5 12.0 - 15.0 g/dL   HCT 74.9 44.9 - 67.5 %   MCV 90.0 80.0 - 100.0 fL   MCH 29.7 26.0 - 34.0 pg   MCHC 33.0 30.0 - 36.0 g/dL   RDW 91.6 38.4 - 66.5 %   Platelets 172 150 - 400 K/uL   nRBC 0.3 (H) 0.0 - 0.2 %  Comprehensive metabolic panel     Status: Abnormal   Collection Time: 08/16/20  2:05 AM  Result Value Ref Range   Sodium 132 (L) 135 - 145 mmol/L   Potassium 4.2  3.5 - 5.1 mmol/L   Chloride 103 98 - 111 mmol/L   CO2 21 (L) 22 - 32 mmol/L   Glucose, Bld 145 (H) 70 - 99 mg/dL   BUN 6 6 - 20 mg/dL   Creatinine, Ser 9.93 0.44 - 1.00 mg/dL   Calcium 8.4 (L) 8.9 - 10.3 mg/dL   Total Protein 5.7 (L) 6.5 - 8.1 g/dL   Albumin 2.4 (L) 3.5 - 5.0 g/dL   AST 30 15 - 41 U/L   ALT 16 0 - 44 U/L   Alkaline Phosphatase 126 38 - 126 U/L   Total Bilirubin 0.5 0.3 - 1.2 mg/dL   GFR, Estimated >57 >01 mL/min   Anion gap 8 5 - 15  Magnesium     Status: Abnormal   Collection Time: 08/16/20  2:05 AM  Result Value Ref Range   Magnesium 4.5 (H) 1.7 - 2.4 mg/dL   Assessment/Plan: Status post Cesarean section. Doing well postoperatively.  Discontinue magnesium at 1200 Continue labetalol 200mg  po BID  II 08/16/2020, 7:59 AM

## 2020-08-16 NOTE — Lactation Note (Signed)
This note was copied from a baby's chart. Lactation Consultation Note  Patient Name: Tiffany Kirby HMCNO'B Date: 08/16/2020 Reason for consult: Follow-up assessment;Late-preterm 34-36.6wks;Infant < 6lbs;Primapara Age:24 hours   Mom on Mag.  Baby "Tiffany Kirby'" was STS with mom.  Mom states she is not getting anything out and was discouraged so she hasn't pumped since very early this morning. Education provided on pumping what to expect and also LPTI behavior reviewed.  Mom and LC worked on hand expression.  Book used to provide illustrations for mom for reference.  During oral assessment, infant sucked gloved finger.  High palate noted. LC assisted mom in latching infant in laid back position but infant was too sleepy to wake and feed.    Mom doesn't have a pediatrician selected but did mention the pediatricians visiting her here were kind and she liked them.  LC mentioned the [facility] has an LC, Soyla Dryer, RN, IBCLC and patient is also aware of [facility] OP services at the med center, the lactation phone line, and support groups.  LC discussed that  pumping and latching will change over the weeks after DC.  Mom seemed more encouraged at the end of the visit as she really wants to breastfeed.      Plan:  STS and hand express.  Offer the breast with cues using pillows and good head and neck support when latching.  Bottle feed Neo 22 after BF per guidelines. If infant doesn't latch, bottle feed.  Pump every 2-3 hours with a stretch of 4 at night for rest if needed.  Call out for concerns or questions.  Maternal Data Has patient been taught Hand Expression?: Yes Does the patient have breastfeeding experience prior to this delivery?: No  Feeding Mother's Current Feeding Choice: Breast Milk and Formula Nipple Type: Extra Slow Flow  LATCH Score Latch: Too sleepy or reluctant, no latch achieved, no sucking elicited.  Audible Swallowing: None  Type of Nipple: Everted at rest and after  stimulation  Comfort (Breast/Nipple): Soft / non-tender  Hold (Positioning): Assistance needed to correctly position infant at breast and maintain latch.  LATCH Score: 5   Lactation Tools Discussed/Used Tools: Pump Breast pump type: Double-Electric Breast Pump Reason for Pumping: stimulate milk supply for LPTI Pumping frequency: encouraged every 2-3 hours with a 4 hour stretch at night  Interventions Interventions: Breast feeding basics reviewed;Skin to skin;Breast massage;Hand express  Discharge Pump: DEBP  Consult Status Consult Status: Follow-up Date: 08/17/20 Follow-up type: In-patient    Tiffany Kirby [facility] 08/16/2020, 9:32 AM

## 2020-08-16 NOTE — Op Note (Signed)
Tiffany Kirby, Tiffany Kirby MEDICAL RECORD NO: [id number] ACCOUNT NO: [id number] DATE OF BIRTH: 08/11/1995 FACILITY: [facility] LOCATION: [facility] PHYSICIAN: Guy Sandifer. Arleta Creek, MD  Operative Report   DATE OF PROCEDURE: 08/15/2020  PREOPERATIVE DIAGNOSES:   1.  Intrauterine pregnancy at 35 and 3/7 weeks. 2.  Breech. 3.  Preeclampsia with severe features.  POSTOPERATIVE DIAGNOSES: 1.  Intrauterine pregnancy at 35 and 3/7 weeks. 2.  Breech. 3.  Preeclampsia with severe features.  PROCEDURE:  Low transverse cesarean section.  SURGEON:  Harold Hedge II, MD  ANESTHESIA:  Spinal.  ESTIMATED BLOOD LOSS:  Per anesthesia note.  SPECIMENS:  Placenta to pathology.  FINDINGS:  Viable female infant. Apgars, birth weight, arterial cord pH pending.  INDICATIONS AND CONSENT:  This patient is a 25 year old G1, P0 at 47 and 3, who has been followed for preeclampsia.  She developed severe features as detailed in the history and physical.  The baby is breech and cesarean section is recommended for  delivery.  Potential risks and complications are reviewed preoperatively including but not limited to infection, organ damage, bleeding requiring transfusion of blood products with HIV and hepatitis acquisition, DVT, PE, pneumonia and wound breakdown.   The patient states she understands and agrees and consent is signed on the chart.  DESCRIPTION OF PROCEDURE:  The patient is taken to the operating room where she is identified and spinal anesthetic is placed per anesthesiology.  She is placed in the dorsal supine position with a 15-degree left lateral wedge.  She is then prepped  vaginally with Betadine.  Foley catheter is placed and prepped abdominally with ChloraPrep.  After 3-minute drying time, she is draped in sterile fashion.  Timeout is undertaken.  After testing for adequate spinal anesthesia, skin is entered through a  Pfannenstiel incision and dissection is carried out in layers to the peritoneum, which is  incised superiorly and inferiorly.  Vesicouterine peritoneum is taken down cephalad laterally.  Bladder flap developed and the bladder blade placed.  Uterus is  incised in a low transverse manner and the uterine cavity is entered bluntly with a hemostat.  Clear fluid is noted.  The incision is extended with the fingers bilaterally.  Baby is then delivered from the breech position without difficulty.  Cord is  clamped and cut and the baby is handed to waiting pediatrics team.  Placenta is manually delivered.  Uterus is noted to be clean and the uterus is closed in 2 running locking imbricating layers of 0 Monocryl suture, which achieved good hemostasis.  Tubes  and ovaries are normal bilaterally.  Lavage is carried out.  Anterior peritoneum is closed in running fashion with 0 Monocryl suture, which is also used to reapproximate the pyramidalis muscle in the midline.  Anterior rectus fascia is closed in a  running fashion with a 0 looped PDS.  Subcutaneous layer is closed with interrupted plain and the skin is closed in a subcuticular fashion with a 4-0 Vicryl on a Keith needle.  Benzoin, Steri-Strips, honeycomb and then pressure dressing are applied.  All  counts are correct and the patient is taken to recovery room in stable condition.   SHW D: 08/15/2020 7:48:29 pm T: 08/16/2020 4:01:00 am  JOB: [id number]/ [id number]

## 2020-08-17 MED ORDER — ACETAMINOPHEN 500 MG PO TABS: 1000.0000 mg | ORAL_TABLET | Freq: Four times a day (QID) | ORAL | 0 refills | Status: AC | PRN

## 2020-08-17 MED ORDER — IBUPROFEN 600 MG PO TABS
600.0000 mg | ORAL_TABLET | Freq: Four times a day (QID) | ORAL | Status: DC | PRN
  Administered 2020-08-17 (×3): 600 mg via ORAL
  Filled 2020-08-17 (×3): qty 1

## 2020-08-17 MED ORDER — IBUPROFEN 600 MG PO TABS: 600.0000 mg | ORAL_TABLET | Freq: Four times a day (QID) | ORAL | 0 refills | Status: AC | PRN

## 2020-08-17 MED ORDER — OXYCODONE HCL 5 MG PO TABS: 5.0000 mg | ORAL_TABLET | Freq: Four times a day (QID) | ORAL | 0 refills | Status: AC | PRN

## 2020-08-17 NOTE — Discharge Summary (Signed)
Postpartum Discharge Summary  Date of Service updated      Patient Name: Tiffany Kirby DOB: 1995-11-03 MRN: [id number]  Date of admission: 08/15/2020 Delivery date:08/15/2020  Delivering provider: Everlene Farrier  Date of discharge: 08/17/2020  Admitting diagnosis: Preeclampsia, severe, third trimester [O14.13] Intrauterine pregnancy: [redacted]w[redacted]d    Secondary diagnosis:  Active Problems:   Preeclampsia, severe, third trimester  Additional problems:     Discharge diagnosis: Preterm Pregnancy Delivered and Preeclampsia (severe)                                              Post partum procedures: Augmentation: N/A Complications: None  Hospital course: Sceduled C/S   25y.o. yo G1P0101 at 392w3das admitted to the hospital 08/15/2020 for scheduled cesarean section with the following indication:Malpresentation.Delivery details are as follows:  Membrane Rupture Time/Date: 7:03 PM ,08/15/2020   Delivery Method:C-Section, Low Transverse  Details of operation can be found in separate operative note.  Patient had an uncomplicated postpartum course.  She is ambulating, tolerating a regular diet, passing flatus, and urinating well. Patient is discharged home in stable condition on  08/17/20        Newborn Data: Birth date:08/15/2020  Birth time:7:04 PM  Gender:Female  Living status:Living  Apgars:7 ,9  Weight:2535 g     Magnesium Sulfate received: Yes: Seizure prophylaxis BMZ received: No Rhophylac:No MMR:No T-DaP:Given prenatally Flu: No Transfusion:No  Physical exam  Vitals:   08/16/20 1528 08/16/20 2006 08/16/20 2329 08/17/20 0442  BP: 130/70 134/79 119/66 131/78  Pulse: 72 81 78 74  Resp: '18 17 16 18  ' Temp: 97.8 F (36.6 C) (!) 97.5 F (36.4 C) 98.7 F (37.1 C) 97.9 F (36.6 C)  TempSrc: Oral Oral Oral Oral  SpO2: 98% 100% 99% 97%   General: alert, cooperative and no distress Lochia: appropriate Uterine Fundus: firm Incision: Healing well with no significant drainage DVT  Evaluation: No evidence of DVT seen on physical exam. Labs: Lab Results  Component Value Date   WBC 13.4 (H) 08/16/2020   HGB 12.5 08/16/2020   HCT 37.9 08/16/2020   MCV 90.0 08/16/2020   PLT 172 08/16/2020   CMP Latest Ref Rng & Units 08/16/2020  Glucose 70 - 99 mg/dL 145(H)  BUN 6 - 20 mg/dL 6  Creatinine 0.44 - 1.00 mg/dL 0.70  Sodium 135 - 145 mmol/L 132(L)  Potassium 3.5 - 5.1 mmol/L 4.2  Chloride 98 - 111 mmol/L 103  CO2 22 - 32 mmol/L 21(L)  Calcium 8.9 - 10.3 mg/dL 8.4(L)  Total Protein 6.5 - 8.1 g/dL 5.7(L)  Total Bilirubin 0.3 - 1.2 mg/dL 0.5  Alkaline Phos 38 - 126 U/L 126  AST 15 - 41 U/L 30  ALT 0 - 44 U/L 16   Edinburgh Score: No flowsheet data found.    After visit meds:  Allergies as of 08/17/2020   No Known Allergies     Medication List    STOP taking these medications   pantoprazole 40 MG tablet Commonly known as: PROTONIX   valACYclovir 500 MG tablet Commonly known as: VALTREX     TAKE these medications   acetaminophen 500 MG tablet Commonly known as: TYLENOL Take 2 tablets (1,000 mg total) by mouth every 6 (six) hours as needed.   ibuprofen 600 MG tablet Commonly known as: ADVIL Take 1 tablet (600 mg total)  by mouth every 6 (six) hours as needed for moderate pain.   labetalol 200 MG tablet Commonly known as: NORMODYNE Take 1 tablet (200 mg total) by mouth 2 (two) times daily.   oxyCODONE 5 MG immediate release tablet Commonly known as: Oxy IR/ROXICODONE Take 1 tablet (5 mg total) by mouth every 6 (six) hours as needed for severe pain.   PRENATAL ADULT GUMMY/DHA/FA PO Take 2 tablets by mouth daily.   sertraline 25 MG tablet Commonly known as: ZOLOFT Take 25 mg by mouth daily.        Discharge home in stable condition Infant Feeding: Bottle and Breast Infant Disposition:home with mother Discharge instruction: per After Visit Summary and Postpartum booklet. Activity: Advance as tolerated. Pelvic rest for 6 weeks.  Diet:  routine diet Anticipated Birth Control: Unsure Postpartum Appointment:1 week Additional Postpartum F/U: BP check 1 week Future Appointments: Future Appointments  Date Time Provider [place]  08/26/2020  9:30 AM [facility] PAT 1 [facility] None  08/26/2020 10:30 AM [facility] None   Follow up Visit:      08/17/2020 Allena Katz, MD

## 2020-08-17 NOTE — Progress Notes (Signed)
MOB was referred for history of depression/anxiety.  * Referral screened out by Clinical Social Worker because none of the following criteria appear to apply: ~ History of anxiety/depression during this pregnancy, or of post-partum depression following prior delivery. ~ Diagnosis of anxiety and/or depression within last 3 years OR * MOB's symptoms currently being treated with medication and/or therapy. Per chart MOB is currently being treated with Zoloft.    Please contact the Clinical Social Worker if needs arise, by MOB request, or if MOB scores greater than 9/yes to question 10 on Edinburgh Postpartum Depression Screen.   Tanequa Kretz, MSW, LCSW-A Clinical Social Worker [phone] 

## 2020-08-17 NOTE — Lactation Note (Signed)
This note was copied from a baby's chart. Lactation Consultation Note  Patient Name: Tiffany Kirby [id number] Date: 08/17/2020 Reason for consult: Follow-up assessment;Late-preterm 34-36.6wks;1st time breastfeeding;Primapara Age:24 hours   1020 - I followed up with [name]. She reports that she is pumping regularly and seeing minimal to no yield. I provided education on milk production between days 1-3 and provided praise and encouragement.  She states that she has been supplementing baby with ABM, and he has not been latching well. He was sleeping at this time having had a circumcision procedure earlier.  I recommended that she page today for latch assistance.  Plan:  Breast feed on demand 8-12 times a day. Pump every 2-3 hours during the day and every 3-4 hours at night. Call lactation for support today.    Feeding Mother's Current Feeding Choice: Breast Milk and Formula Nipple Type: Extra Slow Flow   Lactation Tools Discussed/Used Tools: Pump Breast pump type: Double-Electric Breast Pump Pump Education: Setup, frequency, and cleaning Reason for Pumping: LPI; difficulty latching Pumping frequency: q3 Pumped volume: 0 mL  Interventions Interventions: Breast feeding basics reviewed;DEBP;Education  Discharge Pump: Personal;DEBP (Motif)  Consult Status Consult Status: Follow-up Date: 08/18/20 Follow-up type: In-patient    Walker Shadow 08/17/2020, 10:42 AM

## 2020-08-17 NOTE — Progress Notes (Signed)
Subjective: Postpartum Day 2: Cesarean Delivery Patient reports tolerating PO, + flatus and no problems voiding.   Wants to go home Objective: Vital signs in last 24 hours: Temp:  [97.5 F (36.4 C)-98.7 F (37.1 C)] 97.9 F (36.6 C) (05/15 0442) Pulse Rate:  [72-84] 74 (05/15 0442) Resp:  [16-18] 18 (05/15 0442) BP: (119-134)/(66-81) 131/78 (05/15 0442) SpO2:  [97 %-100 %] 97 % (05/15 0442)  Physical Exam:  General: alert, cooperative and no distress Lochia: appropriate Uterine Fundus: firm Incision: healing well DVT Evaluation: No evidence of DVT seen on physical exam.  Recent Labs    08/15/20 1542 08/16/20 0205  HGB 13.5 12.5  HCT 40.5 37.9    Assessment/Plan: Status post Cesarean section. Doing well postoperatively.  Discharge home with standard precautions and return to clinic in 4-6 weeks. D/W circumcision of newborn boy. D/W procedure and risks. She states she understands and agrees. Roselle Locus II 08/17/2020, 8:10 AM

## 2020-08-18 ENCOUNTER — Encounter (HOSPITAL_COMMUNITY): Payer: Self-pay | Admitting: Obstetrics and Gynecology

## 2020-08-18 NOTE — Plan of Care (Signed)
  Problem: Clinical Measurements: Goal: Ability to maintain clinical measurements within normal limits will improve Outcome: Progressing   Problem: Coping: Goal: Level of anxiety will decrease Outcome: Progressing   Problem: Pain Managment: Goal: General experience of comfort will improve Outcome: Adequate for Discharge   Problem: Education: Goal: Knowledge of disease or condition will improve Outcome: Progressing Goal: Knowledge of the prescribed therapeutic regimen will improve Outcome: Progressing   Problem: Clinical Measurements: Goal: Complications related to disease process, condition or treatment will be avoided or minimized Outcome: Progressing

## 2020-08-18 NOTE — Lactation Note (Signed)
This note was copied from a baby's chart. Lactation Consultation Note LC to room for f/u visit. Mother is bottle feeding formula. She pumped 3x yesterday and endorses +changes today. We reviewed need to increase pumping and strategies to avoid engorgement. Mom is aware of LC services here and in the community.   Patient Name: Tiffany Kirby [id number] Date: 08/18/2020 Reason for consult: Late-preterm 34-36.6wks;Follow-up assessment Age:24 hours   Feeding Mother's Current Feeding Choice: Formula Nipple Type: Slow - flow  Discharge Discharge Education: Engorgement and breast care  Consult Status Consult Status: Follow-up Follow-up type: In-patient   Elder Negus, MA IBCLC 08/18/2020, 9:30 AM

## 2020-08-18 NOTE — Discharge Summary (Signed)
Postpartum Discharge Summary  Date of Service Aug 18, 2020     Patient Name: Tiffany Kirby DOB: 08/23/1995 MRN: [id number]  Date of admission: 08/15/2020 Delivery date:08/15/2020  Delivering provider: Everlene Farrier  Date of discharge: 08/18/2020  Admitting diagnosis: Preeclampsia, severe, third trimester [O14.13] Intrauterine pregnancy: [redacted]w[redacted]d    Secondary diagnosis:  Active Problems:   Preeclampsia, severe, third trimester  Additional problems: none    Discharge diagnosis: Preterm Pregnancy Delivered                                              Post partum procedures none Augmentation: N/A Complications: None  Hospital course: Sceduled C/S   25y.o. yo G1P0101 at 38w3das admitted to the hospital 08/15/2020 for scheduled cesarean section with the following indication:Malpresentation.Delivery details are as follows:  Membrane Rupture Time/Date: 7:03 PM ,08/15/2020   Delivery Method:C-Section, Low Transverse  Details of operation can be found in separate operative note.  Patient had an uncomplicated postpartum course.  She is ambulating, tolerating a regular diet, passing flatus, and urinating well. Patient is discharged home in stable condition on  08/18/20        Newborn Data: Birth date:08/15/2020  Birth time:7:04 PM  Gender:Female  Living status:Living  Apgars:7 ,9  Weight:2535 g     Magnesium Sulfate received: Yes: Seizure prophylaxis BMZ received: No Rhophylac:No MMR:No T-DaP:Given prenatally Flu: No Transfusion:No  Physical exam  Vitals:   08/17/20 1640 08/17/20 2010 08/17/20 2354 08/18/20 0535  BP: 138/86 (!) 146/108 (!) 149/86 (!) 147/86  Pulse: 74 73 79 80  Resp:  _0 Temp:  98.6 F (37 C) 98.5 F (36.9 C) 98.5 F (36.9 C)  TempSrc:  Oral Oral Oral  SpO2:  100% 100% 98%   General: alert, cooperative and no distress Lochia: appropriate Uterine Fundus: firm Incision: Healing well with no significant drainage DVT Evaluation: No evidence of DVT  seen on physical exam. Labs: Lab Results  Component Value Date   WBC 13.4 (H) 08/16/2020   HGB 12.5 08/16/2020   HCT 37.9 08/16/2020   MCV 90.0 08/16/2020   PLT 172 08/16/2020   CMP Latest Ref Rng & Units 08/16/2020  Glucose 70 - 99 mg/dL 145(H)  BUN 6 - 20 mg/dL 6  Creatinine 0.44 - 1.00 mg/dL 0.70  Sodium 135 - 145 mmol/L 132(L)  Potassium 3.5 - 5.1 mmol/L 4.2  Chloride 98 - 111 mmol/L 103  CO2 22 - 32 mmol/L 21(L)  Calcium 8.9 - 10.3 mg/dL 8.4(L)  Total Protein 6.5 - 8.1 g/dL 5.7(L)  Total Bilirubin 0.3 - 1.2 mg/dL 0.5  Alkaline Phos 38 - 126 U/L 126  AST 15 - 41 U/L 30  ALT 0 - 44 U/L 16   Edinburgh Score: Edinburgh Postnatal Depression Scale Screening Tool 08/18/2020  I have been able to laugh and see the funny side of things. 0  I have looked forward with enjoyment to things. 0  I have blamed myself unnecessarily when things went wrong. 2  I have been anxious or worried for no good reason. 0  I have felt scared or panicky for no good reason. 0  Things have been getting on top of me. 1  I have been so unhappy that I have had difficulty sleeping. 0  I have felt sad or miserable. 0  I have been  so unhappy that I have been crying. 1  The thought of harming myself has occurred to me. 0  Edinburgh Postnatal Depression Scale Total 4      After visit meds:  Allergies as of 08/18/2020   No Known Allergies     Medication List    STOP taking these medications   pantoprazole 40 MG tablet Commonly known as: PROTONIX   valACYclovir 500 MG tablet Commonly known as: VALTREX     TAKE these medications   acetaminophen 500 MG tablet Commonly known as: TYLENOL Take 2 tablets (1,000 mg total) by mouth every 6 (six) hours as needed.   ibuprofen 600 MG tablet Commonly known as: ADVIL Take 1 tablet (600 mg total) by mouth every 6 (six) hours as needed for moderate pain.   labetalol 200 MG tablet Commonly known as: NORMODYNE Take 1 tablet (200 mg total) by mouth 2 (two)  times daily.   oxyCODONE 5 MG immediate release tablet Commonly known as: Oxy IR/ROXICODONE Take 1 tablet (5 mg total) by mouth every 6 (six) hours as needed for severe pain.   PRENATAL ADULT GUMMY/DHA/FA PO Take 2 tablets by mouth daily.   sertraline 25 MG tablet Commonly known as: ZOLOFT Take 25 mg by mouth daily.            Discharge Care Instructions  (From admission, onward)         Start     Ordered   08/18/20 0000  Leave dressing on - Keep it clean, dry, and intact until clinic visit        08/18/20 0858           Discharge home in stable condition Infant Feeding: Breast Infant Disposition:home with mother Discharge instruction: per After Visit Summary and Postpartum booklet. Activity: Advance as tolerated. Pelvic rest for 6 weeks.  Diet: routine diet Anticipated Birth Control: Unsure Postpartum Appointment:1 week Additional Postpartum F/U: Incision check 1 week and BP check 1 week Future Appointments: Future Appointments  Date Time Provider [place]  08/26/2020  9:30 AM [facility]-LD PAT 1 [facility]INDC None  08/26/2020 10:30 AM [facility]-SCREENING [facility]SDSC None   Follow up Visit:      08/18/2020 Cyril Mourning, MD

## 2020-08-18 NOTE — Plan of Care (Signed)
  Problem: Education: Goal: Knowledge of General Education information will improve Description: Including pain rating scale, medication(s)/side effects and non-pharmacologic comfort measures Outcome: Adequate for Discharge   Problem: Health Behavior/Discharge Planning: Goal: Ability to manage health-related needs will improve Outcome: Adequate for Discharge   Problem: Clinical Measurements: Goal: Ability to maintain clinical measurements within normal limits will improve Outcome: Adequate for Discharge Goal: Will remain free from infection Outcome: Adequate for Discharge Goal: Diagnostic test results will improve Outcome: Adequate for Discharge Goal: Respiratory complications will improve Outcome: Adequate for Discharge   Problem: Nutrition: Goal: Adequate nutrition will be maintained Outcome: Adequate for Discharge   Problem: Coping: Goal: Level of anxiety will decrease Outcome: Adequate for Discharge   Problem: Elimination: Goal: Will not experience complications related to bowel motility Outcome: Adequate for Discharge Goal: Will not experience complications related to urinary retention Outcome: Adequate for Discharge   Problem: Pain Managment: Goal: General experience of comfort will improve Outcome: Adequate for Discharge   Problem: Safety: Goal: Ability to remain free from injury will improve Outcome: Adequate for Discharge   Problem: Skin Integrity: Goal: Risk for impaired skin integrity will decrease Outcome: Adequate for Discharge   Problem: Education: Goal: Knowledge of condition will improve Outcome: Adequate for Discharge Goal: Individualized Educational Video(s) Outcome: Adequate for Discharge   Problem: Activity: Goal: Will verbalize the importance of balancing activity with adequate rest periods Outcome: Adequate for Discharge   Problem: Life Cycle: Goal: Chance of risk for complications during the postpartum period will decrease Outcome:  Adequate for Discharge   Problem: Role Relationship: Goal: Ability to demonstrate positive interaction with newborn will improve Outcome: Adequate for Discharge   Problem: Skin Integrity: Goal: Demonstration of wound healing without infection will improve Outcome: Adequate for Discharge   Problem: Education: Goal: Knowledge of disease or condition will improve Outcome: Adequate for Discharge Goal: Knowledge of the prescribed therapeutic regimen will improve Outcome: Adequate for Discharge   Problem: Clinical Measurements: Goal: Complications related to disease process, condition or treatment will be avoided or minimized Outcome: Adequate for Discharge

## 2020-08-18 NOTE — Progress Notes (Signed)
D/c instructions given, all questions answered, pt verbalized understanding. Pt is alert and oriented x4. Pt is ambulatory and tolerable pain. Pt waiting on baby d/c before leaving.

## 2020-08-19 LAB — SURGICAL PATHOLOGY

## 2020-08-26 ENCOUNTER — Encounter (HOSPITAL_COMMUNITY)
Admission: RE | Admit: 2020-08-26 | Discharge: 2020-08-26 | Disposition: A | Discharge status: Home / Self Care | Payer: 59 | Source: Ambulatory Visit | Attending: Obstetrics and Gynecology | Admitting: Obstetrics and Gynecology

## 2020-08-26 ENCOUNTER — Other Ambulatory Visit (HOSPITAL_COMMUNITY): Payer: 59

## 2020-08-28 ENCOUNTER — Inpatient Hospital Stay (HOSPITAL_COMMUNITY): Admit: 2020-08-28 | Payer: Self-pay | Admitting: Obstetrics and Gynecology

## 2021-03-26 IMAGING — US US OB COMP LESS 14 WK
1 of 2 series · 13 of 28 positions shown · non-contrast
Comparison: none

[Series 2: us ob comp less 14 wk · 13 of 56 slices shown]
[im 3/56]
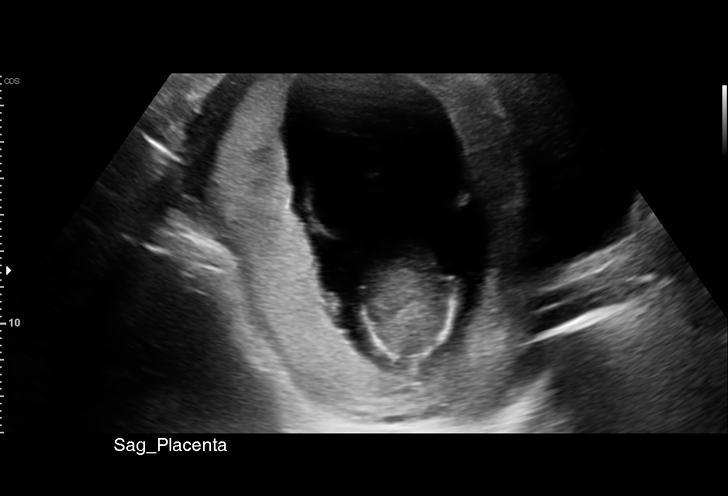
[im 7/56]
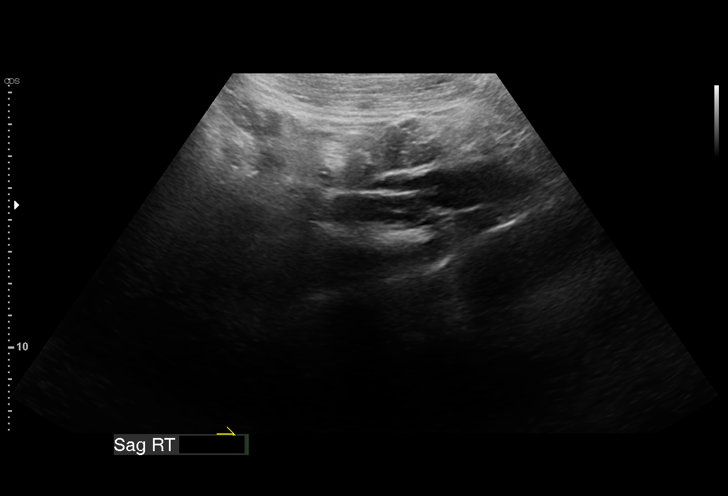
[im 11/56]
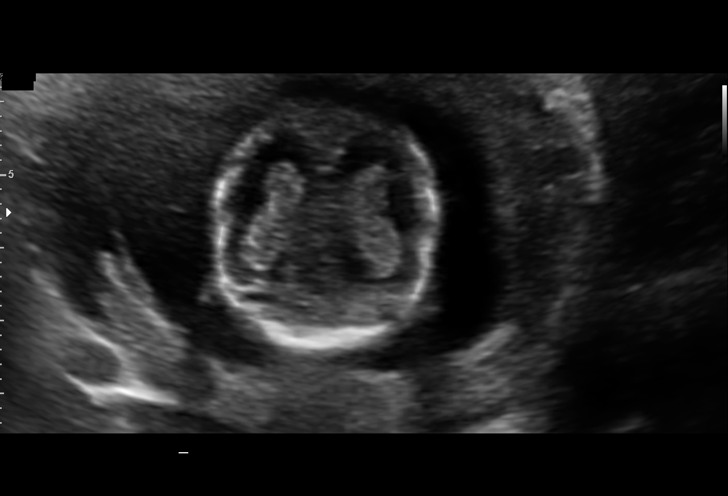
[im 15/56]
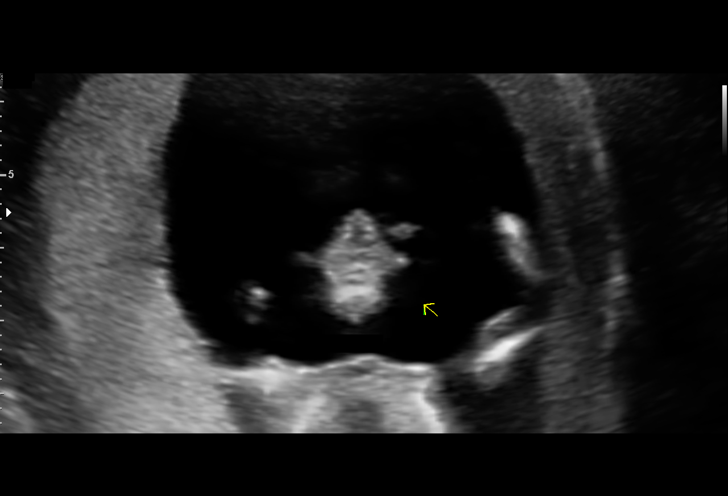
[im 20/56]
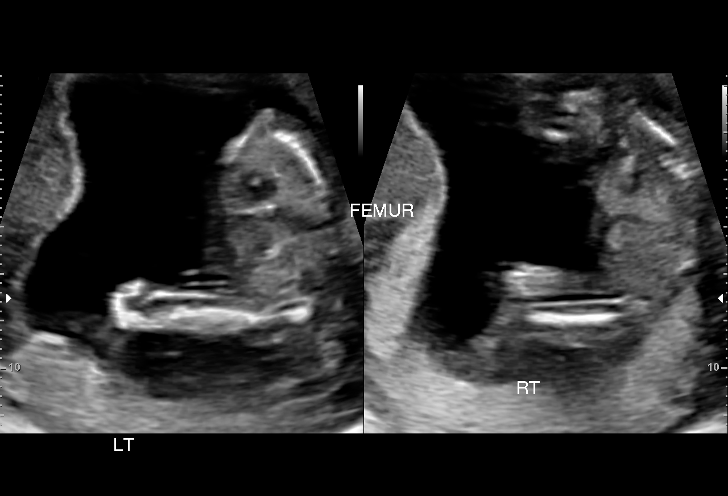
[im 24/56]
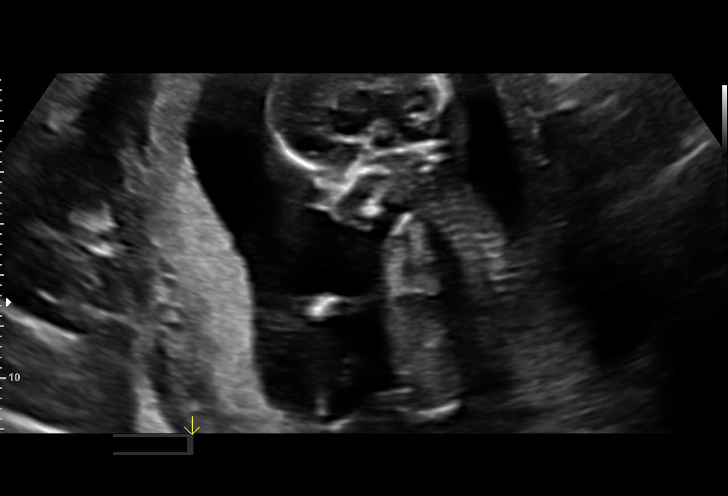
[im 30/56]
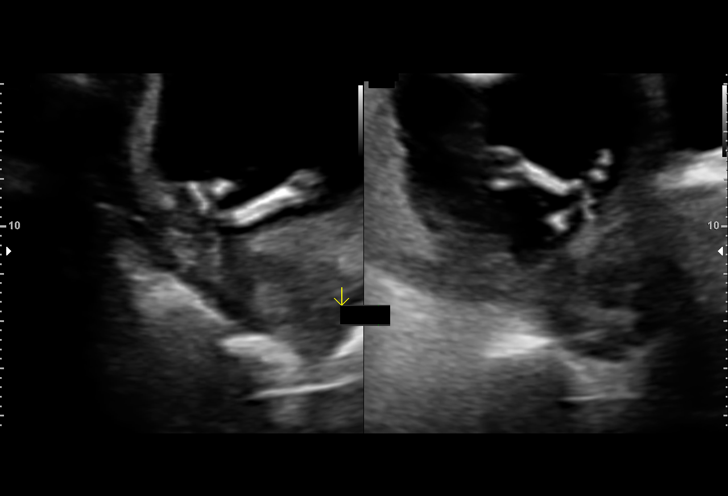
[im 34/56]
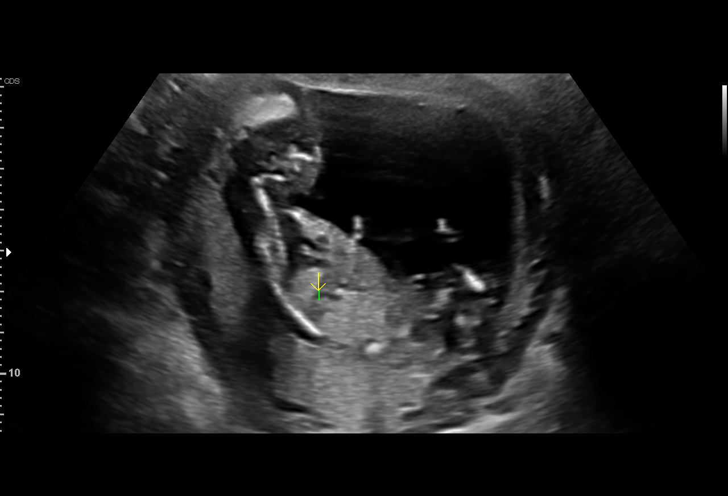
[im 39/56]
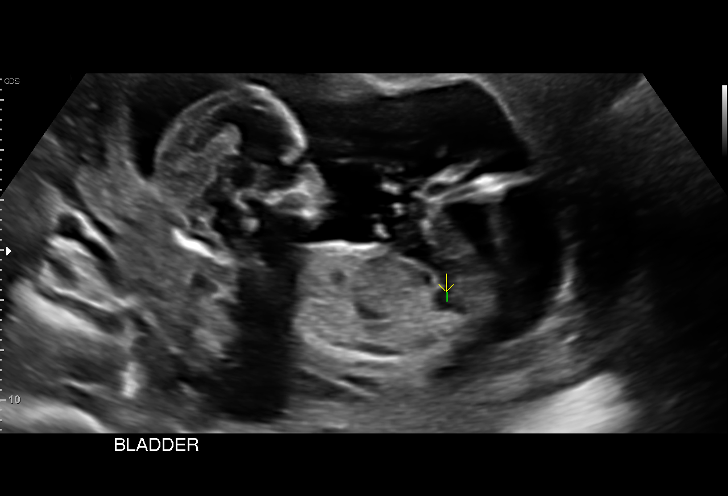
[im 43/56]
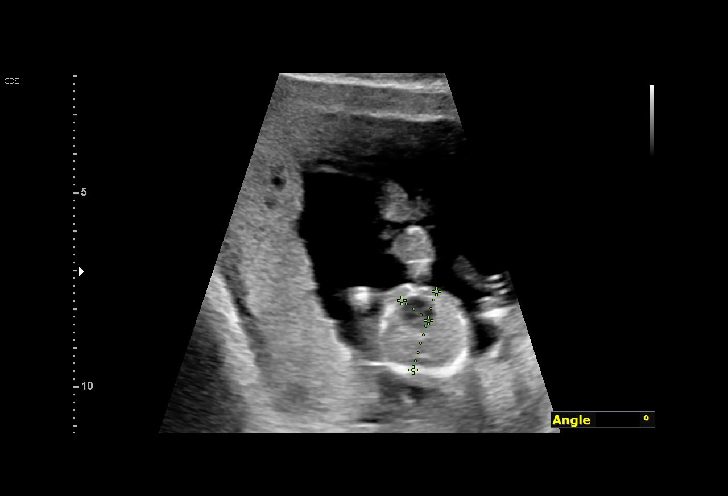
[im 47/56]
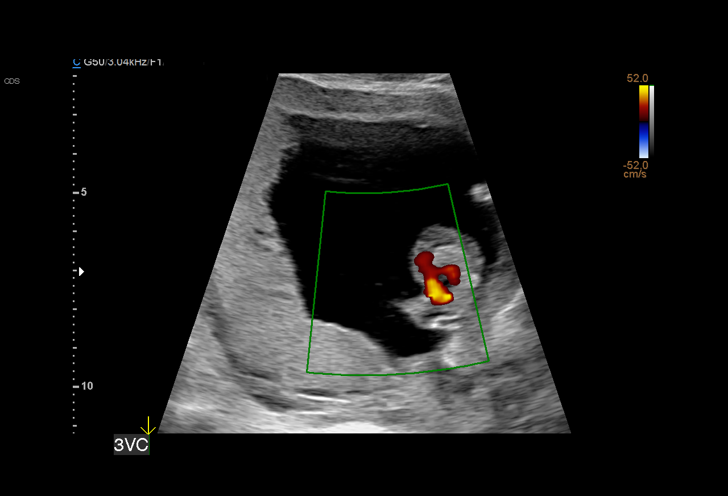
[im 51/56]
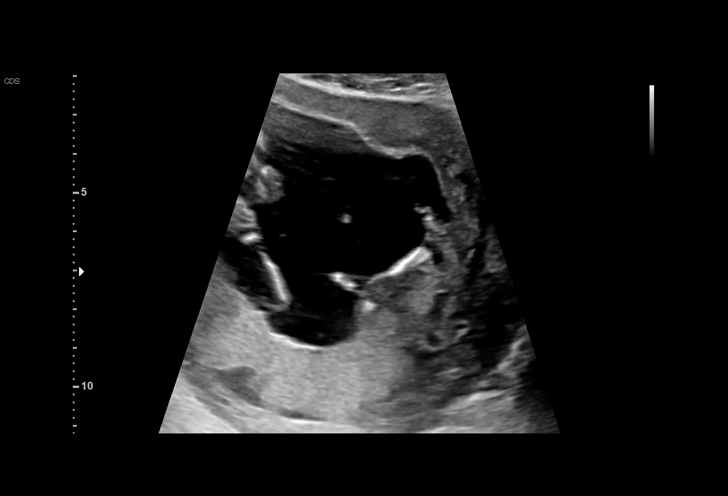
[im 56/56]
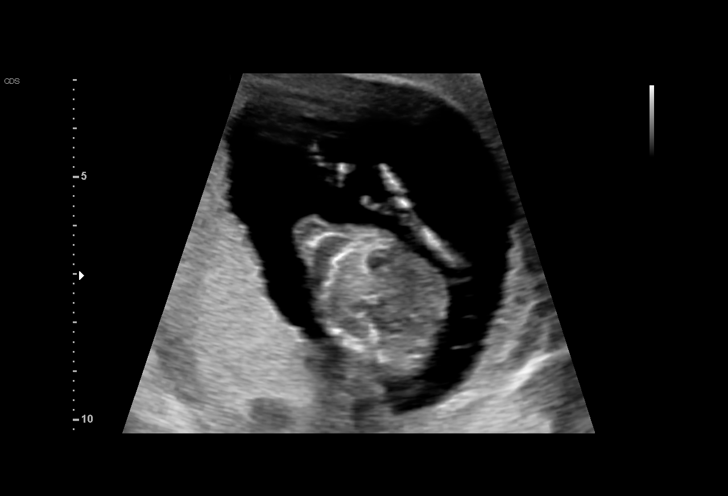

[13 of 28 positions shown; findings below may reference images not displayed]

ANATOMY

Indications

 14 weeks gestation of pregnancy
 Encounter for antenatal screening for other
 genetic defects
Fetal Evaluation

 Num Of Fetuses:         1
 Fetal Heart Rate(bpm):  150
 Cardiac Activity:       Observed
 Presentation:           Breech
 Placenta:               Posterior
 P. Cord Insertion:      Not well visualized

 Amniotic Fluid
 AFI FV:      Within normal limits

                             Largest Pocket(cm)

Biometry

 BPD:      27.9  mm     G. Age:  15w 0d                  CI:        74.88   %    70 - 86
                                                         FL/HC:      15.1   %
 HC:      102.3  mm     G. Age:  14w 6d                  HC/AC:      1.23        1.14 -
 AC:       83.3  mm     G. Age:  14w 5d                  FL/BPD:     55.2   %
 FL:       15.4  mm     G. Age:  14w 4d                  FL/AC:      18.5   %    20 - 24
 HUM:      14.1  mm     G. Age:  13w 6d
 CER:      15.6  mm     G. Age:  16w 2d
 CM:        1.2  mm

 Est. FW:     103  gm      0 lb 4 oz
OB History

 Gravidity:    1
Gestational Age

 Clinical EDD:  14w 0d                                        EDD:   09/16/20
 U/S Today:     14w 6d                                        EDD:   09/10/20
 Best:          14w 0d     Det. By:  Clinical EDD             EDD:   09/16/20
Anatomy

 Cranium:               Appears normal         Abdominal Wall:         Appears nml
 Choroid Plexus:        Appears normal         Cord Vessels:           Appears normal (3
                                                                       vessel cord)
 Face:                  Appears normal         Bladder:                Appears normal
 Heart:                 Appears normal         Upper Extremities:      Visualized
                        (4CH, axis, and
                        situs)
 Stomach:               Appears normal, left   Lower Extremities:      Visualized
                        sided
Cervix Uterus Adnexa

 Cervix
 Length:           3.03  cm.
 Normal appearance by transabdominal scan.

 Uterus
 No abnormality visualized.

 Right Ovary
 Not visualized.

 Left Ovary
 Not visualized.

 Cul De Sac
 No free fluid seen.

 Adnexa
 No abnormality visualized.
Impression

 G1 P0.  Patient is here for ultrasound evaluation and genetic
 counseling.  On first trimester screening, she had increased
 risk for fetal Down syndrome (1 and 131). OVCHAROV was low at
 0.3 MoM and beta hCG was increased.  The nuchal
 translucency measured 1.4 mm at your office.

 On today's ultrasound, fetal biometry is consistent with the
 previously established dates.  Amniotic fluid is normal and
 good fetal activity seen.  Anatomical survey (consistent with
 first trimester anatomy) appears normal.  No evidence of
 cystic hygroma.  Nasal bone is seen.

 I reassured the couple of normal-appearing fetal anatomy
 scan.  However a detailed fetal anatomical survey should be
 performed at 18 to 20 weeks gestation.

 Patient met with our genetic counselor and opted to have cell
 free fetal DNA screening.  She will call her insurance for
 eligibility.

 Fredyfredro Cachalot can be associated with fetal growth restriction
 and we recommend fetal growth assessment at 32 weeks
 gestation.

 I also discussed invasive testing (chorionic villous sampling
 now or amniocentesis after 16 weeks).  I explained the
 procedures and possible complication of miscarriage in 1 and
 500 procedures.  Patient opted not to have chorionic villous
 sampling.
Recommendations

 -Patient will call our genetic counselor if she is willing to
 undergo cell free fetal DNA screening.
 -Fetal anatomy scan at 18 to 20 weeks gestation.
                 Ver, Fasol

## 2021-08-18 IMAGING — US US MFM FETAL BPP W/O NON-STRESS
1 series · 15 of 28 positions shown · non-contrast
Comparison: none

[Series 1: us mfm fetal bpp w/o non-stress · 32 acquisitions, 15 frames shown]
[im 1/32]
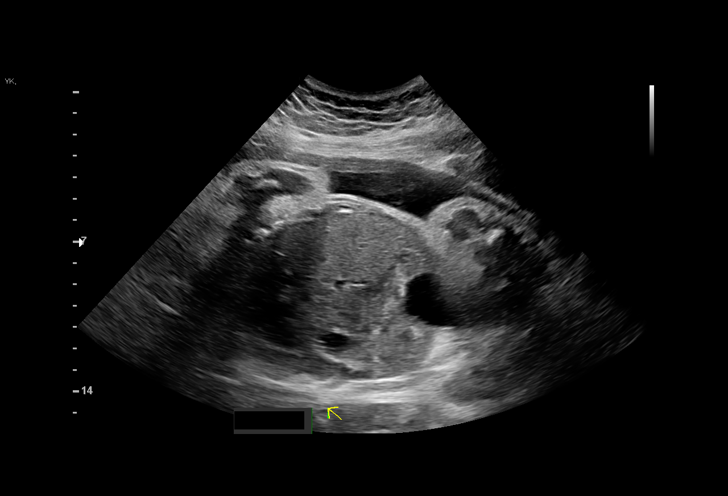
[im 3/32]
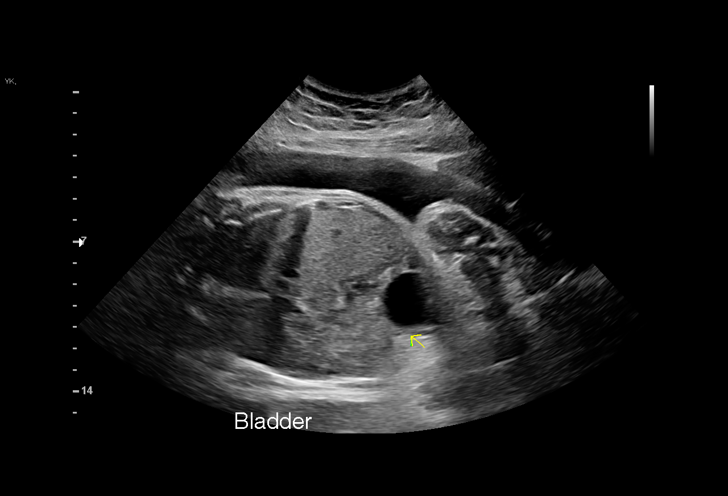
[im 5/32]
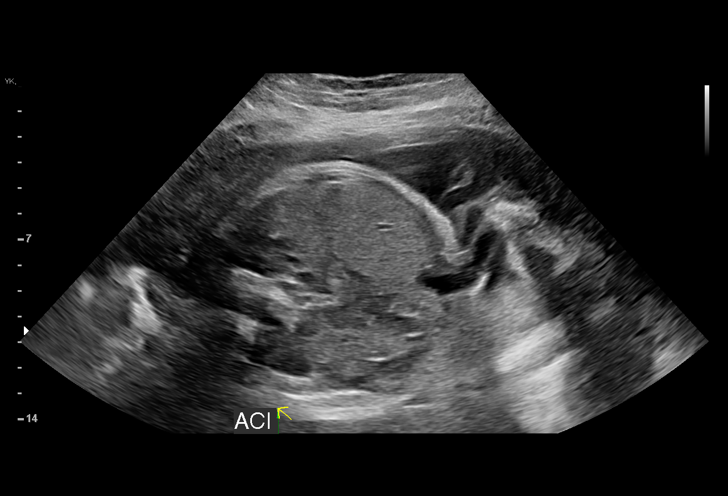
[im 7/32]
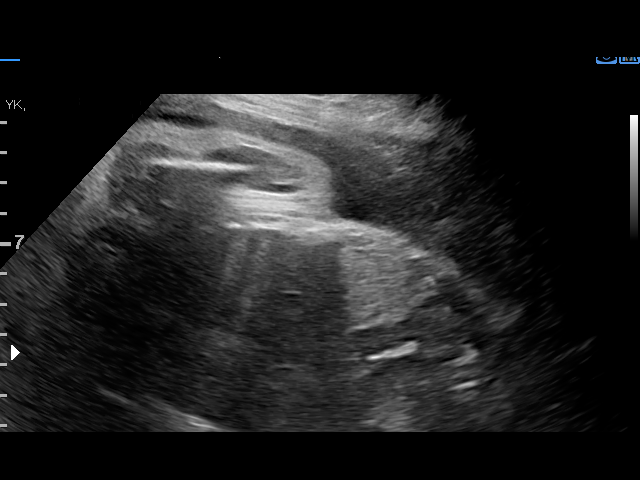
[im 10/32]
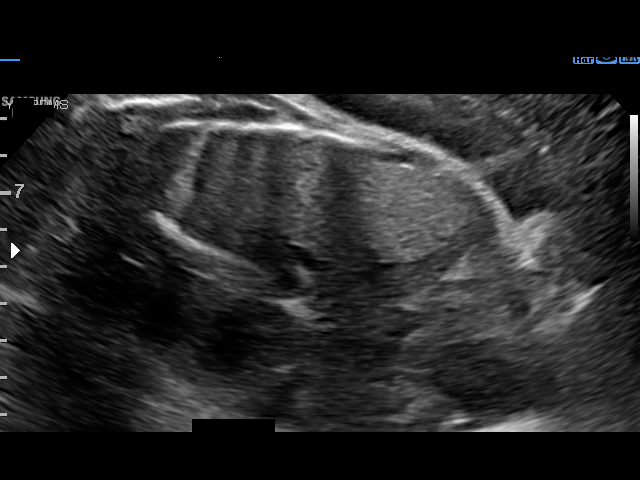
[im 12/32]
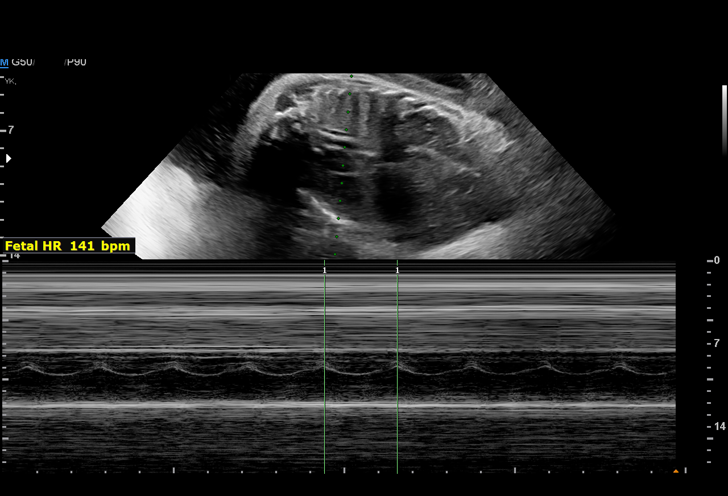
[im 14/32]
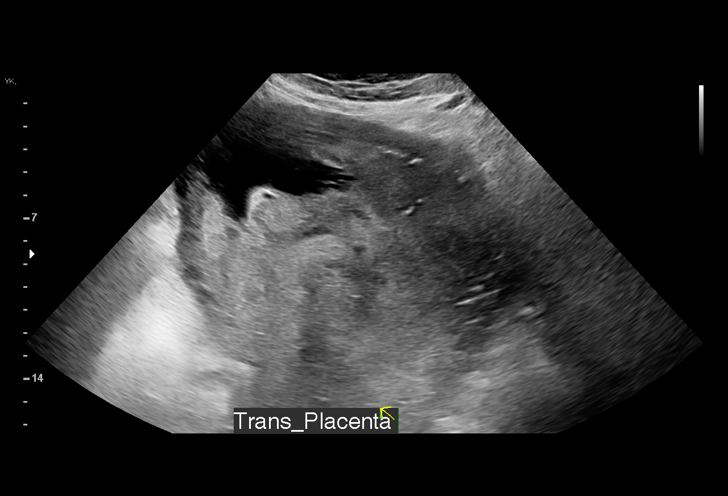
[im 17/32]
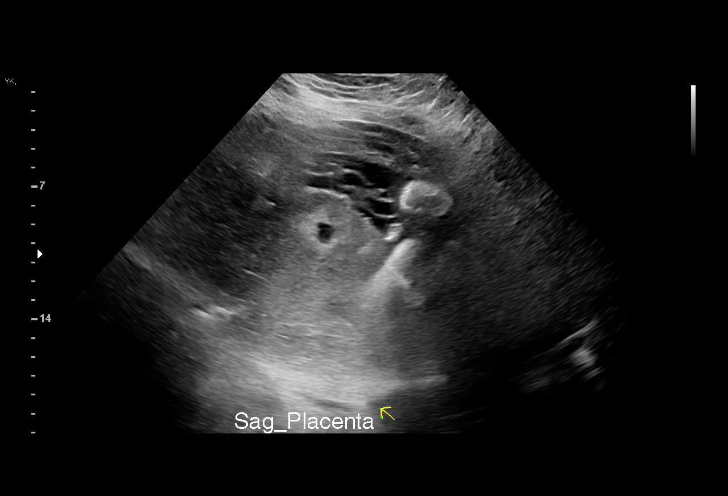
[im 18/32]
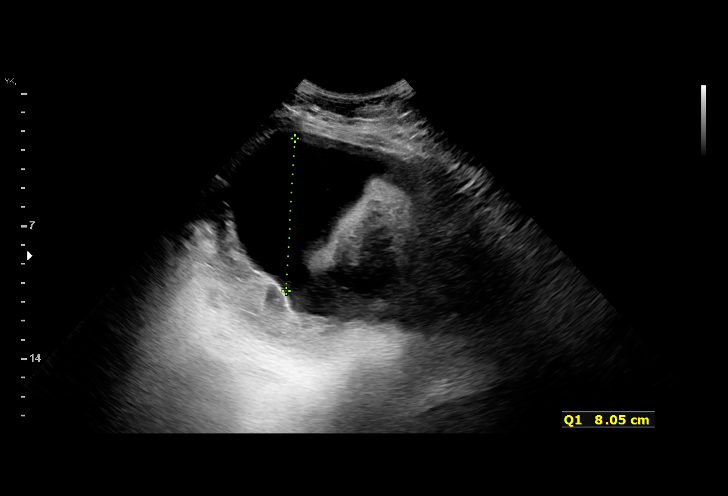
[im 20/32]
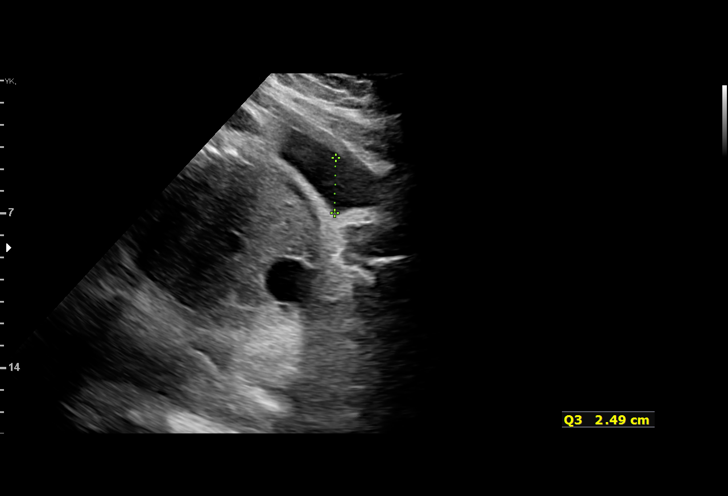
[im 22/32]
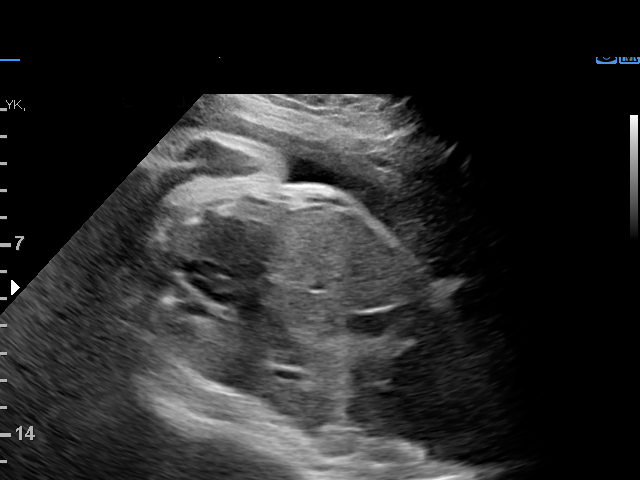
[im 25/32]
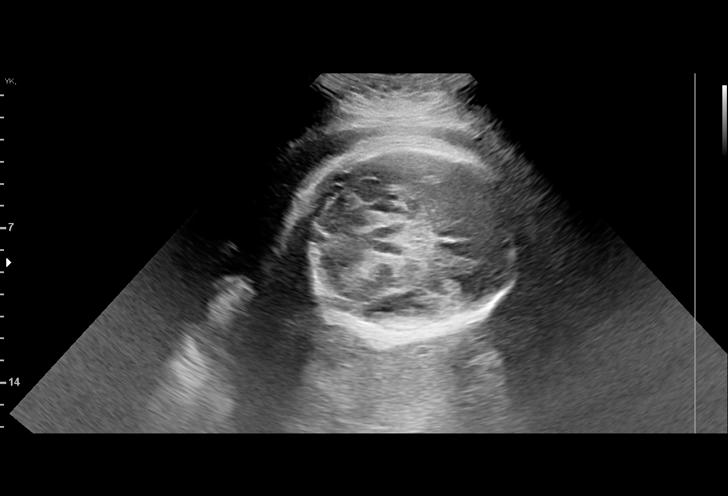
[im 27/32]
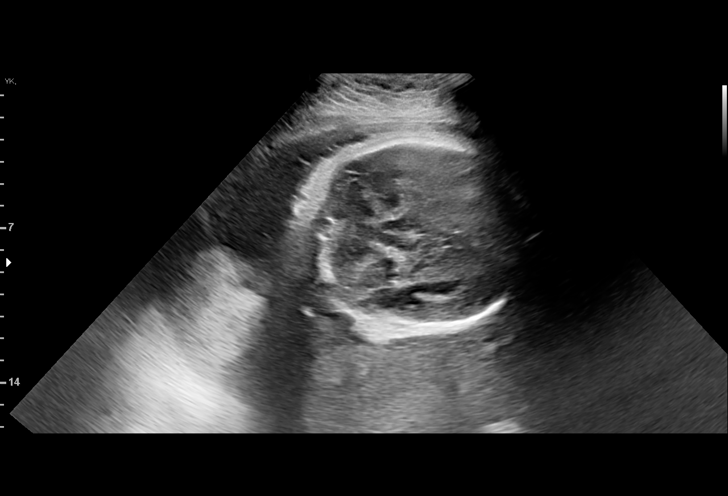
[im 29/32]
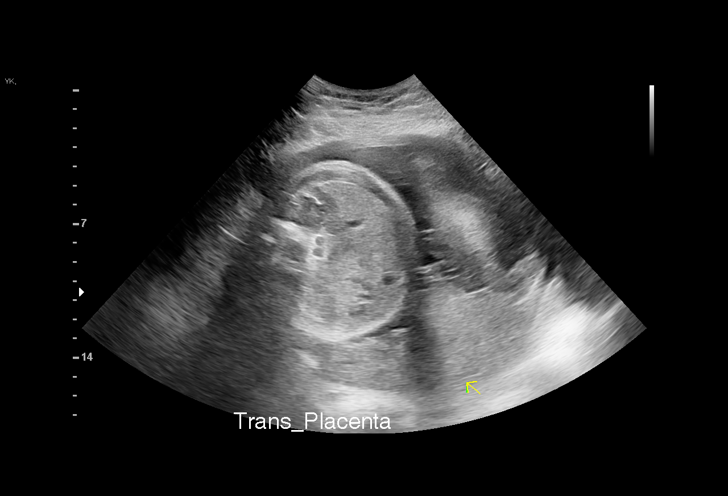
[im 32/32]
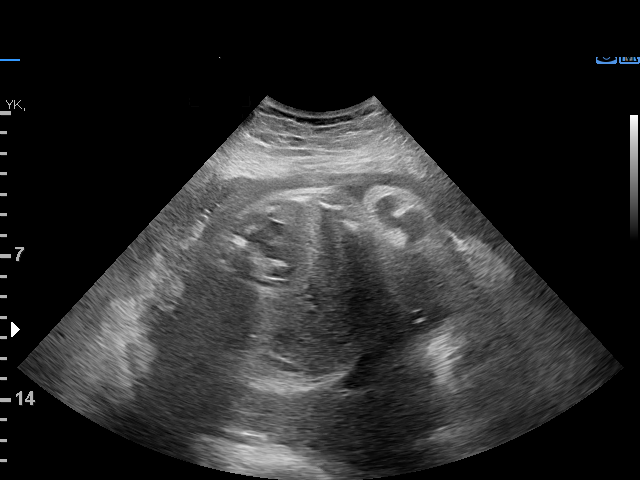

[15 of 28 positions shown; findings below may reference images not displayed]

Care
 Referred By:      STEFO NININ            [facility]                                  [[facility]]

Indications

 Non-reactive NST
 Unspecified preeclampsia, third trimester
 Unspecified maternal hypertension, third
 trimester (on Labetolol)
 34 weeks gestation of pregnancy
Fetal Evaluation

 Num Of Fetuses:          1
 Fetal Heart Rate(bpm):   128
 Cardiac Activity:        Observed
 Presentation:            Breech, complete
 Placenta:                Posterior
 P. Cord Insertion:       Previously Visualized

 Amniotic Fluid
 AFI FV:      Within normal limits

 AFI Sum(cm)     %Tile       Largest Pocket(cm)
 15.6            56

 RUQ(cm)       RLQ(cm)       LUQ(cm)        LLQ(cm)

Biophysical Evaluation

 Amniotic F.V:   Within normal limits       F. Tone:         Observed
 F. Movement:    Observed                   Score:           [DATE]
 F. Breathing:   Observed
OB History

 Gravidity:    1
Gestational Age

 Clinical EDD:  34w 5d                                        EDD:   09/16/20
 Best:          34w 5d     Det. By:  Clinical EDD             EDD:   09/16/20
Anatomy

 Cranium:               Appears normal         Kidneys:                Appear normal
 Stomach:               Appears normal, left   Bladder:                Appears normal
                        sided
Impression

 Antenatal testing performed given non-reactive NST.
 The biophysical profile was [DATE] with good fetal movement and
 amniotic fluid volume.
Recommendations

 Clinical correlation recommended.
